# Patient Record
Sex: Male | Born: 1995 | Race: White | Hispanic: No | Marital: Single | Smoking: Never smoker
Health system: Southern US, Community
[De-identification: ages and names within clinical notes are randomized; demographics above are authoritative.]

## PROBLEM LIST (undated history)

## (undated) DIAGNOSIS — I10 Essential (primary) hypertension: Secondary | ICD-10-CM

## (undated) DIAGNOSIS — E039 Hypothyroidism, unspecified: Secondary | ICD-10-CM

## (undated) DIAGNOSIS — E109 Type 1 diabetes mellitus without complications: Secondary | ICD-10-CM

## (undated) HISTORY — DX: Type 1 diabetes mellitus without complications: E10.9

## (undated) HISTORY — DX: Hypothyroidism, unspecified: E03.9

---
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
## 2016-02-01 LAB — LIPID PANEL
CHOL/HDL RATIO: 3.4 ratio (ref <=5.0)
Cholesterol: 156 mg/dL (ref 125–170)
HDL: 46 mg/dL (ref 31–65)
LDL CALC: 94 mg/dL (ref <110)
Triglycerides: 81 mg/dL (ref 38–152)
VLDL: 16 mg/dL (ref <30)

## 2016-02-01 LAB — COMPREHENSIVE METABOLIC PANEL
ALT: 17 U/L (ref 8–46)
AST: 16 U/L (ref 12–32)
Albumin: 4.1 g/dL (ref 3.6–5.1)
Alkaline Phosphatase: 85 U/L (ref 48–230)
BUN: 14 mg/dL (ref 7–20)
CALCIUM: 9.3 mg/dL (ref 8.9–10.4)
CO2: 26 mmol/L (ref 20–31)
Chloride: 99 mmol/L (ref 98–110)
Creat: 0.88 mg/dL (ref 0.60–1.26)
GLUCOSE: 367 mg/dL — AB (ref 70–99)
POTASSIUM: 4.6 mmol/L (ref 3.8–5.1)
Sodium: 134 mmol/L — ABNORMAL LOW (ref 135–146)
Total Bilirubin: 0.9 mg/dL (ref 0.2–1.1)
Total Protein: 6.5 g/dL (ref 6.3–8.2)

## 2016-02-01 LAB — MICROALBUMIN / CREATININE URINE RATIO: CREATININE, URINE: 57 mg/dL (ref 20–370)

## 2016-02-01 LAB — HEMOGLOBIN A1C
HEMOGLOBIN A1C: 11.7 % — AB (ref <5.7)
Mean Plasma Glucose: 289 mg/dL

## 2016-02-01 LAB — T4, FREE: Free T4: 1.5 ng/dL — ABNORMAL HIGH (ref 0.8–1.4)

## 2016-02-01 LAB — T3, FREE: T3, Free: 3.1 pg/mL (ref 3.0–4.7)

## 2016-02-01 LAB — TSH: TSH: 2.36 m[IU]/L (ref 0.50–4.30)

## 2016-02-02 ENCOUNTER — Encounter: Payer: Self-pay | Admitting: *Deleted

## 2016-02-02 ENCOUNTER — Ambulatory Visit (INDEPENDENT_AMBULATORY_CARE_PROVIDER_SITE_OTHER): Payer: Medicaid Other | Admitting: "Endocrinology

## 2016-02-02 ENCOUNTER — Encounter: Payer: Self-pay | Admitting: "Endocrinology

## 2016-02-02 VITALS — BP 132/87 | HR 84 | Wt 152.0 lb

## 2016-02-02 DIAGNOSIS — E1065 Type 1 diabetes mellitus with hyperglycemia: Principal | ICD-10-CM

## 2016-02-02 DIAGNOSIS — E049 Nontoxic goiter, unspecified: Secondary | ICD-10-CM

## 2016-02-02 DIAGNOSIS — E10649 Type 1 diabetes mellitus with hypoglycemia without coma: Secondary | ICD-10-CM

## 2016-02-02 DIAGNOSIS — I10 Essential (primary) hypertension: Secondary | ICD-10-CM

## 2016-02-02 DIAGNOSIS — E1043 Type 1 diabetes mellitus with diabetic autonomic (poly)neuropathy: Secondary | ICD-10-CM

## 2016-02-02 DIAGNOSIS — E038 Other specified hypothyroidism: Secondary | ICD-10-CM

## 2016-02-02 DIAGNOSIS — E109 Type 1 diabetes mellitus without complications: Secondary | ICD-10-CM

## 2016-02-02 DIAGNOSIS — R000 Tachycardia, unspecified: Secondary | ICD-10-CM | POA: Diagnosis not present

## 2016-02-02 DIAGNOSIS — IMO0001 Reserved for inherently not codable concepts without codable children: Secondary | ICD-10-CM

## 2016-02-02 DIAGNOSIS — E063 Autoimmune thyroiditis: Secondary | ICD-10-CM

## 2016-02-02 LAB — POCT GLYCOSYLATED HEMOGLOBIN (HGB A1C): HEMOGLOBIN A1C: 11.7

## 2016-02-02 LAB — GLUCOSE, POCT (MANUAL RESULT ENTRY): POC GLUCOSE: 109 mg/dL — AB (ref 70–99)

## 2016-02-02 NOTE — Patient Instructions (Signed)
Follow up visit in 2 months. Please call Dr. Fransico Shenea Giacobbe in two weeks on a Sunday evening between 8:00-9:30 PM. Please repeat thyroid tests one week prior to next appointment.

## 2016-02-02 NOTE — Progress Notes (Signed)
Subjective:  Patient Name: Chad Holloway Date of Birth: 07-29-1996  MRN: [id number]  Chad Holloway  presents to the office today for follow-up evaluation and management of his T1DM, hypoglycemia, hypothyroidism, thyroiditis, goiter, autonomic neuropathy, tachycardia, growth delay, and hypertension.  HISTORY OF PRESENT ILLNESS:   Chad Holloway is a 20 y.o. Caucasian young man.  Eileen was unaccompanied.  1. Chad Holloway was diagnosed with T1DM in 2000 at age 6 years. His first visit to our clinic was on 04/19/05 when he was referred to [place] by his PCP, Dr. Loyola Mast, for E&M of his T1DM.  In 2009 we converted him to a Medtronic Paradigm 722 insulin pump. Although his HbA1c values were generally better on the pump, there were times when he was so noncompliant that his HbA1c value rose to > 14%. In the intervening years Chad Holloway has developed autonomic neuropathy and tachycardia as complications of poorly controlled T1DM. In addition, he developed hypothyroidism, secondary to Hashimoto's Thyroiditis. As a result of all these issues, there have been months in which his linear growth was compromised. He has also developed hypertension over time. He re-started his Medtronic 530G and Enlite sensor on 10/21/14, but often does not use the sensor  2. The patient's last PSSG visit was on 11/05/14.  A.On 12/12/15 he was admitted to the [facility] in [place], [place] for DKA. He says that he thought that he had been taking enough insulin, but no intercurrent illness was diagnosed that might have provoked the DKA.    B. In the interim, he had been generally healthy. He is enjoying college, but has to work harder and has more academic stress. He is supposed to take his 10 mg lisinopril and Synthroid every night, but misses 3-4 does per week. The Synthroid dose is supposed to be 25 mcg/day for 5 days per week and 37.5 mcg/day on Wednesdays and Sundays. He was not exercising very often at college, but is doing so more frequently now that he  is home for Summer vacation. He consumes about 6 caffeinated sodas per day.   3. Pertinent Review of Systems:  Constitutional: The patient feels "good".  Eyes: Vision is good with his glasses. His last eye appointment was in 2016. There were no signs of diabetic eye damage. He has a FU appointment soon.  Neck: The patient has no complaints of anterior neck swelling, soreness, tenderness, pressure, discomfort, or difficulty swallowing.   Heart: Heart rate increases with exercise or other physical activity. The patient has no complaints of palpitations, irregular heart beats, chest pain, or chest pressure.   Gastrointestinal: The patient has no complaints of postprandial bloating, excessive hunger, acid reflux, upset stomach, stomach aches or pains, diarrhea, or constipation.  Legs: Muscle mass and strength seem normal. There are no other complaints of numbness, tingling, burning, or pain. No edema is noted.  Feet: There are no obvious foot problems. There are no complaints of numbness, tingling, burning, or pain. No edema is noted. Neurologic: There are no recognized problems with muscle movement and strength, sensation, or coordination. GU: He has not had any nocturia for quite some time.   Diabetes ID: None - Mom will order one soon.   Blood Glucose Printout: He changes sites every 4-5 days. He checks BGs 1-6 times per day, average 4.4 times per day. He boluses 1-5 times per day. He sometimes goes for up to 24 hours without checking BGs or taking boluses. He has not had any documented low  BGs. He has  had 10 BGs >400. 6 of these 10 BGs were due to bad sites. 2/10 were due to poor sites. His average BG is 262, range 126 to >400. When his sites are working and he checks BGs 3-4 times per day and boluses 3-4 times per day, BGs vary from 143-188. He still sometimes eats but forgets to check BGs or bolus. He has not been wearing his sensor, so he has not had any threshold suspends.    MEDICAL, FAMILY,  AND SOCIAL HISTORY  Past Medical History  Diagnosis Date  . Diabetes mellitus type I   . Hypothyroidism     Family History  Problem Relation Age of Onset  . Diabetes Mother   . Hypertension Mother   . Thyroid disease Mother   . Obesity Mother   . Thyroid disease Father   . Diabetes Brother      Current outpatient prescriptions:  .  glucagon 1 MG injection, Follow package directions for low blood sugar., Disp: 3 each, Rfl: 4 .  GuaiFENesin (COUGH SYRUP PO), Take by mouth., Disp: , Rfl:  .  insulin lispro (HUMALOG) 100 UNIT/ML injection, USE 300 UNITS IN INSULIN PUMP EVERY 48 HOURS, Disp: 15 vial, Rfl: 4 .  levothyroxine (SYNTHROID, LEVOTHROID) 25 MCG tablet, Take 1 tablet 5 days a week and 1.5 tablets 2 days a week, Disp: 105 tablet, Rfl: 4 .  lidocaine-prilocaine (EMLA) cream, APPLY A SMALL AMOUNT TO SKIN AS DIRECTED 30 TO 45 MINUTES PRIOR TO INSERTING INSULIN PUMP INFUSION SET, Disp: 30 g, Rfl: 3 .  lisinopril (PRINIVIL,ZESTRIL) 10 MG tablet, Take 1 tablet (10 mg total) by mouth daily., Disp: 90 tablet, Rfl: 4 .  NOVOLOG FLEXPEN 100 UNIT/ML FlexPen, USE FOR BACKUP IF PUMP FAILS WITH SLIDING SCALE UP TO 12 UNITS SUBCUTANEOUSLY FOUR TIMES DAILY FOR HYPERGLYCEMIA AND DKA (Patient not taking: Reported on 11/05/2014), Disp: 5 pen, Rfl: 6  Allergies as of 02/02/2016  . (No Known Allergies)     reports that he has never smoked. He has never used smokeless tobacco. He reports that he does not drink alcohol or use illicit drugs. Pediatric History  Patient Guardian Status  . Mother:  Jaclyn Prime   Other Topics Concern  . Not on file   Social History Narrative   Lives with mom, step dad, 4 brothers         1. School and family: He just finished his freshman year at [employer] in [place], [place] in August. He wants to major in music education. He has a 3.4 GPA. 2. Activities: He has been doing a lot of marching band, concert band, and jazz band at school. He plays pick  up basketball a lot when he is home.  3. Primary Care Provider: Norman Clay, MD  REVIEW OF SYSTEMS: There are no other significant problems involving Chad Holloway's other body systems.   Objective:  Vital Signs:  BP 132/87 mmHg  Pulse 84  Wt 152 lb (68.947 kg)  No height on file for this encounter.  Ht Readings from Last 3 Encounters:  11/05/14 5' 5.95" (1.675 m) (12 %*, Z = -1.16)  10/27/14 5' 5.87" (1.673 m) (12 %*, Z = -1.19)  10/21/14 5' 6.02" (1.677 m) (13 %*, Z = -1.13)   * Growth percentiles are based on CDC 2-20 Years data.   Wt Readings from Last 3 Encounters:  02/02/16 152 lb (68.947 kg) (50 %*, Z = -0.01)  11/05/14 144 lb (65.318 kg) (46 %*, Z = -0.11)  10/27/14 142 lb 3.2 oz (64.501 kg) (43 %*, Z = -0.19)   * Growth percentiles are based on CDC 2-20 Years data.    There is no height on file to calculate BSA. No height on file for this encounter. 50%ile (Z=-0.01) based on CDC 2-20 Years weight-for-age data using vitals from 02/02/2016.  PHYSICAL EXAM:  Constitutional: The patient looks good overall. He is alert and bright. He is maturing more over time. He has gained 8 pounds in 15 months.  His height is at the 12.22%, but is gradually plateauing. His weight has increased to the 50%.  Eyes: There is no obvious arcus or proptosis. Moisture appears normal. Neck: The neck appears to be visibly normal. The strap muscles have increased in bulk over time. The thyroid gland is probably smaller at about 21+ grams in size. The right lobe has shrunk down to top-normal size. The left lobe is mildly enlarged. The consistency of the thyroid gland is relatively firm. The thyroid gland is not tender to palpation. Lungs: His lungs are clear. He moves air well.  Heart: Heart rate and rhythm are regular. Heart sounds S1 and S2 are normal. I did not appreciate any pathologic cardiac murmurs. Abdomen: The abdomen is normal in size. Bowel sounds are normal. There is no obvious hepatomegaly,  splenomegaly, or other mass effect.  Arms: Muscle size and bulk are normal for age. Hands: He has 1+ fine tremor. Phalangeal and metacarpophalangeal joints are normal. Palmar muscles are normal for age. Palmar skin is normal. Palmar moisture is also normal. Legs: Muscles appear normal for age. No edema is present.  Feet: The feet appears normal. He has a 1-2+ DP pulse on the right and 2+ DP pulse on the left.  Neurologic: Strength is normal for age in both the upper and lower extremities. Muscle tone is normal.  Sensation to touch is intact in the legs and feet.   LAB DATA:  Results for orders placed or performed in visit on 02/02/16  POCT Glucose (CBG)  Result Value Ref Range   POC Glucose 109 (A) 70 - 99 mg/dl   Labs 1/61/095/17/17: [id number] 09.8%11.7%  Labs 01/31/16: HbA1c 11.7%; TSH 2.36, free T4 1.5, free T3 3.1; CMP normal except for a sodium of 134 and glucose of 367; cholesterol 156, triglycerides 81, HDL 46, LDL 94; urine microalbumin too low to measure  Labs 10/19/14: HbA1c 10.2%; TSH 1.101, free T4 1.38, free T3 3.3  Labs 08/05/14: Hemoglobin A1c 10.3%, compared with 9.7% at last visit and with 10.7% at the prior visit.  Labs 11/26/13:  TSH 1.725, free T4 1.34, free T3 3.7; CMP normal, except for glucose 171; cholesterol 125, triglycerides 89, HDL 44, LDL 63; urinary microalbumin/creatinine ratio 3.8  Labs 07/31/13: TSH 2.298, free T4 1.29, free T3 3.6  Labs 04/04/13: TSH 4.249, free T4 1.29, free T3 4.0  Labs 01/09/13: TSH 3.044, free T4 1.29, free T3 3.1; Microalbumin/creatinine ratio 4.3; CMP normal except glucose of 161; cholesterol 137, triglycerides 61, HDL 51, LDL 74   Labs 06/17/12: TSH 0.928, free T4 1.26, free T3 3.7   Assessment and Plan:   ASSESSMENT:  1. Type 1 diabetes: His BGs are much higher due to him not taking as good care of his DM on a regular basis. There are too many gaps between BG checks and insulin boluses. His basal rates may need minor adjustment, but are doing  fairly well overall. Life often gets in the way of his DM care.  2. Hypoglycemia: He has not had any documented low BGs.  3. Hypothyroidism:  He was clinically and chemically euthyroid in March 2015 and again in February 2016 on his current Synthroid dose regimen. Since he has been taking his Synthroid only about 4 times per week, we can't rely on his recent TFT results to adjust his Synthroid dose. We need to repeat his TFTS in 6-8 weeks after he resumes taking the medication every day.  4. Growth delay: He appears to be having the typical end-of-puberty plateauing of growth velocity for height.  5. Hypertension: His BP is elevated. He has only been taking his lisinopril about 4 days per week. he needs to take it every day.    6. Goiter: His thyroid gland has decreased in size since his last visit. The process of waxing and waning of thyroid gland size is c/w evolving Hashimoto's thyroiditis.  7-8. Autonomic neuropathy and inappropriate sinus tachycardia: His heart rate has increased since last visit, paralleling the worsening in BG control.   PLAN:  1. Diagnostic: Reviewed recent lab results. Call Dr. Fransico Michael in two weeks on a Sunday evening between 8:00-8:30 PM to discuss BGs. Call earlier if having low BGs.  Repeat TFTs in 6-7 weeks.   2. Therapeutic: Take Synthroid 25 mcg/day for 5 days per week and 37.5 mcg/day for two days each week.  Take lisinopril 10 mg daily. New basal rates:  MN: 0.950 -> 1.00 5 AM:  1.15 -> 1. 30 8 AM: 1.00 -> 1.15 2 PM: 0.950 -> 1.10 8 PM: 0.850 -> 0.950  3. Patient education: Reviewed pump download and recent lab results. Discussed systems to improve his consistency with taking medications, checking BGs, and taking insulin boluses.  Discussed wearing a diabetes ID bracelet. Discussed the new Medtronic 630G pump and sensor.  4. Follow-up: 2 months   Level of Service: This visit lasted in excess of 60 minutes. More than 50% of the visit was devoted to  counseling.  [name], MD

## 2016-03-06 ENCOUNTER — Other Ambulatory Visit: Payer: Self-pay | Admitting: "Endocrinology

## 2016-03-06 ENCOUNTER — Other Ambulatory Visit: Payer: Self-pay | Admitting: Pediatrics

## 2016-03-06 DIAGNOSIS — IMO0001 Reserved for inherently not codable concepts without codable children: Secondary | ICD-10-CM

## 2016-03-06 DIAGNOSIS — E1065 Type 1 diabetes mellitus with hyperglycemia: Principal | ICD-10-CM

## 2016-04-03 ENCOUNTER — Ambulatory Visit: Payer: Medicaid Other | Admitting: "Endocrinology

## 2016-04-18 ENCOUNTER — Ambulatory Visit (INDEPENDENT_AMBULATORY_CARE_PROVIDER_SITE_OTHER): Payer: BC Managed Care – PPO | Admitting: "Endocrinology

## 2016-04-18 ENCOUNTER — Encounter: Payer: Self-pay | Admitting: *Deleted

## 2016-04-18 VITALS — BP 136/85 | HR 86 | Wt 157.4 lb

## 2016-04-18 DIAGNOSIS — I10 Essential (primary) hypertension: Secondary | ICD-10-CM

## 2016-04-18 DIAGNOSIS — E10649 Type 1 diabetes mellitus with hypoglycemia without coma: Secondary | ICD-10-CM | POA: Diagnosis not present

## 2016-04-18 DIAGNOSIS — E038 Other specified hypothyroidism: Secondary | ICD-10-CM

## 2016-04-18 DIAGNOSIS — E1043 Type 1 diabetes mellitus with diabetic autonomic (poly)neuropathy: Secondary | ICD-10-CM | POA: Diagnosis not present

## 2016-04-18 DIAGNOSIS — Z91199 Patient's noncompliance with other medical treatment and regimen due to unspecified reason: Secondary | ICD-10-CM

## 2016-04-18 DIAGNOSIS — E1065 Type 1 diabetes mellitus with hyperglycemia: Principal | ICD-10-CM

## 2016-04-18 DIAGNOSIS — E109 Type 1 diabetes mellitus without complications: Secondary | ICD-10-CM

## 2016-04-18 DIAGNOSIS — E049 Nontoxic goiter, unspecified: Secondary | ICD-10-CM

## 2016-04-18 DIAGNOSIS — E063 Autoimmune thyroiditis: Secondary | ICD-10-CM

## 2016-04-18 DIAGNOSIS — IMO0001 Reserved for inherently not codable concepts without codable children: Secondary | ICD-10-CM

## 2016-04-18 DIAGNOSIS — Z9119 Patient's noncompliance with other medical treatment and regimen: Secondary | ICD-10-CM

## 2016-04-18 LAB — POCT GLYCOSYLATED HEMOGLOBIN (HGB A1C): Hemoglobin A1C: 13.5

## 2016-04-18 LAB — GLUCOSE, POCT (MANUAL RESULT ENTRY): POC GLUCOSE: 102 mg/dL — AB (ref 70–99)

## 2016-04-18 NOTE — Patient Instructions (Signed)
Follow up visit in one month.  
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
## 2016-04-19 ENCOUNTER — Encounter: Payer: Self-pay | Admitting: "Endocrinology

## 2016-04-19 LAB — T4, FREE: Free T4: 1.4 ng/dL (ref 0.8–1.4)

## 2016-04-19 LAB — T3, FREE: T3 FREE: 3.5 pg/mL (ref 3.0–4.7)

## 2016-04-19 LAB — TSH: TSH: 2.45 mIU/L (ref 0.50–4.30)

## 2016-05-17 ENCOUNTER — Other Ambulatory Visit: Payer: Self-pay | Admitting: *Deleted

## 2016-05-17 ENCOUNTER — Encounter: Payer: Self-pay | Admitting: Family

## 2016-05-17 ENCOUNTER — Ambulatory Visit (INDEPENDENT_AMBULATORY_CARE_PROVIDER_SITE_OTHER): Payer: BC Managed Care – PPO | Admitting: Family

## 2016-05-17 VITALS — BP 134/81 | HR 75 | Wt 155.4 lb

## 2016-05-17 DIAGNOSIS — E109 Type 1 diabetes mellitus without complications: Secondary | ICD-10-CM | POA: Diagnosis not present

## 2016-05-17 DIAGNOSIS — E063 Autoimmune thyroiditis: Secondary | ICD-10-CM

## 2016-05-17 DIAGNOSIS — E038 Other specified hypothyroidism: Secondary | ICD-10-CM

## 2016-05-17 DIAGNOSIS — IMO0001 Reserved for inherently not codable concepts without codable children: Secondary | ICD-10-CM

## 2016-05-17 DIAGNOSIS — Z4681 Encounter for fitting and adjustment of insulin pump: Secondary | ICD-10-CM | POA: Diagnosis not present

## 2016-05-17 DIAGNOSIS — E1065 Type 1 diabetes mellitus with hyperglycemia: Principal | ICD-10-CM

## 2016-05-17 DIAGNOSIS — I10 Essential (primary) hypertension: Secondary | ICD-10-CM

## 2016-05-17 LAB — GLUCOSE, POCT (MANUAL RESULT ENTRY): POC GLUCOSE: 278 mg/dL — AB (ref 70–99)

## 2016-05-17 LAB — POCT GLYCOSYLATED HEMOGLOBIN (HGB A1C): HEMOGLOBIN A1C: 9.8

## 2016-05-17 NOTE — Patient Instructions (Signed)
Basal Changes  - 12am: 1.0--> 0.95 - 2pm: 1.10--> 1.05   - Work on using temp basals  - Especially useful during activity  - Continue checking at least 4x per day  - Pre bolus for food. Do at least 30 carbs prior to eating then  Give the rest after you finish.

## 2016-05-17 NOTE — Progress Notes (Signed)
Pediatric Endocrinology Diabetes Consultation Follow-up Visit  Chad Holloway [facility] 07/18/1996 [id number]  Chief Complaint: Follow-up type 1 diabetes   Chad Clay, MD   HPI: Chad Holloway  is a 20 y.o. male presenting for follow-up of type 1 diabetes.   1. Chad Holloway was diagnosed with T1DM in 2000 at age 70 years. His first visit to our clinic was on 04/19/05 when he was referred to [place] by his PCP, Dr. Loyola Holloway, for E&M of his T1DM.  In 2009 we converted him to a Medtronic Paradigm 722 insulin pump. Although his HbA1c values were generally better on the pump, there were times when he was so noncompliant that his HbA1c value rose to > 14%.  2. Since last visit to [facility] on 8/17, he has been well.  No ER visits or hospitalizations. Chad Holloway reports that since his last visit he has continued to work very hard to get his blood sugars back on track. He admits that over the summer he let his diabetes care slip when his girl friend broke up with him, but now he is back on track. He reports that he has been checking frequently and making sure to bolus for all of his food. He is currently bolusing after he eat. He reports very few lows, most of his lows occur when he is playing basketball or doing something high activity. He is back in college at [employer] and is in the band and is also in a fraternity. He wants to discuss starting a CGM when he is back in town for fall break to get extra help with his diabetes care. He is taking is Lisinopril "some days" and takes synthroid every day.   Insulin regimen: Basal Rates 12AM 1.0  5am 1.30  8am 1.15  2pm 1.10  8pm 0.950    Insulin to Carbohydrate Ratio 12AM  15  6am 12  9pm 15          Insulin Sensitivity Factor 12AM 60  6am 40  9pm 60         Target Blood Glucose 12AM 140  6am 110  9pm 140          Hypoglycemia: Able to feel low blood sugars.  No glucagon needed recently.  Blood glucose download: Checking Bg 6.3 times per day. Avg Bg 197. He has a  few scattered lows usually in the afternoon when he goes to the gym. None of his lows are severe or require glucagon.  Med-alert ID: Not currently wearing. Injection sites: butt and stomach  Annual labs due: 2017 Ophthalmology due: 2017 discussed today.     3. ROS: Greater than 10 systems reviewed with pertinent positives listed in HPI, otherwise neg. Constitutional: Reports that his energy is improved.  Eyes: No changes in vision Ears/Nose/Mouth/Throat: No difficulty swallowing. Cardiovascular: No palpitations Respiratory: No increased work of breathing Gastrointestinal: No constipation or diarrhea. No abdominal pain Genitourinary: No nocturia, no polyuria Musculoskeletal: No joint pain Neurologic: Normal sensation, no tremor Endocrine: No polydipsia.  No hyperpigmentation Psychiatric: Normal affect  Past Medical History:   Past Medical History:  Diagnosis Date  . Diabetes mellitus type I (HCC)   . Hypothyroidism     Medications:  Outpatient Encounter Prescriptions as of 05/17/2016  Medication Sig  . glucagon 1 MG injection Follow package directions for low blood sugar.  . insulin lispro (HUMALOG) 100 UNIT/ML injection USE 300 UNITS IN INSULIN PUMP EVERY 48 HOURS  . levothyroxine (SYNTHROID, LEVOTHROID) 25 MCG tablet Take 1 tablet 5  days a week and 1.5 tablets 2 days a week  . lisinopril (PRINIVIL,ZESTRIL) 10 MG tablet Take 1 tablet (10 mg total) by mouth daily.  Marland Kitchen. lisinopril (PRINIVIL,ZESTRIL) 10 MG tablet TAKE ONE TABLET BY MOUTH ONCE DAILY  . GuaiFENesin (COUGH SYRUP PO) Take by mouth.  . insulin aspart (NOVOLOG FLEXPEN) 100 UNIT/ML FlexPen Use up to 50 units daily (Patient not taking: Reported on 05/17/2016)  . lidocaine-prilocaine (EMLA) cream APPLY A SMALL AMOUNT TO SKIN AS DIRECTED 30 TO 45 MINUTES PRIOR TO INSERTING INSULIN PUMP INFUSION SET (Patient not taking: Reported on 05/17/2016)   No facility-administered encounter medications on file as of 05/17/2016.      Allergies: No Known Allergies  Surgical History: No past surgical history on file.  Family History:  Family History  Problem Relation Age of Onset  . Diabetes Mother   . Hypertension Mother   . Thyroid disease Mother   . Obesity Mother   . Thyroid disease Father   . Diabetes Brother       Social History: Lives with roommates in college.  Currently in is sophomore year of college.   Physical Exam:  Vitals:   05/17/16 1352 05/17/16 1357  BP:  134/81  Pulse:  75  Weight: 70.5 kg (155 lb 6.4 oz) 70.5 kg (155 lb 6.4 oz)   BP 134/81   Pulse 75   Wt 70.5 kg (155 lb 6.4 oz)  Body mass index: body mass index is unknown because there is no height or weight on file. No height on file for this encounter.  Ht Readings from Last 3 Encounters:  11/05/14 5' 5.95" (1.675 m) (12 %, Z= -1.16)*  10/27/14 5' 5.87" (1.673 m) (12 %, Z= -1.19)*  10/21/14 5' 6.02" (1.677 m) (13 %, Z= -1.13)*   * Growth percentiles are based on CDC 2-20 Years data.   Wt Readings from Last 3 Encounters:  05/17/16 70.5 kg (155 lb 6.4 oz) (53 %, Z= 0.09)*  04/18/16 71.4 kg (157 lb 6.4 oz) (57 %, Z= 0.18)*  02/02/16 68.9 kg (152 lb) (50 %, Z= -0.01)*   * Growth percentiles are based on CDC 2-20 Years data.    PHYSICAL EXAM:   Constitutional: He is happy, alert and interactive.  Eyes: There is no obvious arcus or proptosis. Moisture appears normal. Neck: The neck appears to be visibly normal.  The thyroid gland is normal in size. . The consistency of the thyroid gland is relatively firm. The thyroid gland is not tender to palpation. Lungs: His lungs are clear. He moves air well.  Heart: Heart rate and rhythm are regular. Heart sounds S1 and S2 are normal. I did not appreciate any pathologic cardiac murmurs. Abdomen: The abdomen is normal in size. Bowel sounds are normal. There is no obvious hepatomegaly, splenomegaly, or other mass effect.  Arms: Muscle size and bulk are normal for age. Hands: He  has no tremor. Phalangeal and metacarpophalangeal joints are normal. Palmar muscles are normal for age. Palmar skin is normal. Palmar moisture is also normal. Legs: Muscles appear normal for age. No edema is present.  Feet: The feet appears normal. He has a 2+ DP pulse on the right and a 2+ DP pulse on the left.  Neurologic: Strength is normal for age in both the upper and lower extremities. Muscle tone is normal.  Sensation to touch is intact in the legs and feet.  Labs: Last hemoglobin A1c:  Lab Results  Component Value Date   HGBA1C 9.8  05/17/2016   Results for orders placed or performed in visit on 05/17/16  POCT Glucose (CBG)  Result Value Ref Range   POC Glucose 278 (A) 70 - 99 mg/dl  POCT HgB Z6X  Result Value Ref Range   Hemoglobin A1C 9.8     Assessment/Plan: Tyresse is a 20 y.o. male with type 1 diabetes in fair/improving control. He has done much better with his diabetes care since his last visit. He appears very motivated to maintain good care after all the hard work he has done. He is interested in starting a CGM for further monitoring of his blood sugars.  1. DM w/o complication type I, uncontrolled (HCC) - Continue to check bg at least 4 times per day and before driving  - Discussed using temp basal prior/during/after exercise to prevent hypoglycemia.  - Bolus prior to eating to stop large spikes in blood sugars after meals.  - POCT Glucose (CBG) - POCT HgB A1C  2. Essential Hypertension  Continue to take Lisinopril 10 mg daily   3. Hypothyroidism, acquired, autoimmune Synthroid daily as prescribed.   4. Insulin Pump Titration Basal Changes  - 12am: 1.0--> 0.95 - 2pm: 1.10--> 1.05  Follow-up:   2 months  Medical decision-making:  > 40 minutes spent, more than 50% of appointment was spent discussing diagnosis and management of symptoms  Gretchen Short, FNP-C

## 2016-05-18 ENCOUNTER — Encounter: Payer: Self-pay | Admitting: Family

## 2016-06-04 ENCOUNTER — Telehealth: Payer: Self-pay | Admitting: "Endocrinology

## 2016-06-04 NOTE — Telephone Encounter (Signed)
1. I called Chad Holloway to discuss his recent TFTs. 2. His recent TFTs are WNL, but are outside of the ideal replacement range for him.  3. I asked him to change his Synthroid dosage to 37.5 mcg/day on three days per week and 25 mcg/day on the remaining 4 days each week. David StallBRENNAN,MICHAEL J

## 2016-06-23 ENCOUNTER — Telehealth (INDEPENDENT_AMBULATORY_CARE_PROVIDER_SITE_OTHER): Payer: Self-pay | Admitting: Family

## 2016-06-23 ENCOUNTER — Other Ambulatory Visit (INDEPENDENT_AMBULATORY_CARE_PROVIDER_SITE_OTHER): Payer: Self-pay | Admitting: *Deleted

## 2016-06-23 DIAGNOSIS — IMO0001 Reserved for inherently not codable concepts without codable children: Secondary | ICD-10-CM

## 2016-06-23 DIAGNOSIS — E1065 Type 1 diabetes mellitus with hyperglycemia: Principal | ICD-10-CM

## 2016-06-23 MED ORDER — LEVOTHYROXINE SODIUM 25 MCG PO TABS: ORAL_TABLET | ORAL | 4 refills | Status: DC

## 2016-06-23 MED ORDER — INSULIN ASPART 100 UNIT/ML ~~LOC~~ SOLN: SUBCUTANEOUS | 6 refills | Status: DC

## 2016-06-23 MED ORDER — LISINOPRIL 10 MG PO TABS
10.0000 mg | ORAL_TABLET | Freq: Every day | ORAL | 4 refills | Status: DC
Start: 2016-06-23 — End: 2018-04-03

## 2016-06-23 NOTE — Telephone Encounter (Signed)
Scripts sent

## 2016-06-23 NOTE — Telephone Encounter (Signed)
Please change pharm to [facility] @ [address] in [place] and send rxs for lisinopril, synthroid, and insulin ([employer] will not cover humalog any longer). Mom stated [name] changed the dosage for either the lisinopril or the synthroid @ the last visit, but doesn't know which one.

## 2016-07-03 ENCOUNTER — Ambulatory Visit (INDEPENDENT_AMBULATORY_CARE_PROVIDER_SITE_OTHER): Payer: Self-pay | Admitting: Family

## 2016-08-28 ENCOUNTER — Ambulatory Visit (INDEPENDENT_AMBULATORY_CARE_PROVIDER_SITE_OTHER): Payer: Self-pay | Admitting: Family

## 2016-08-31 ENCOUNTER — Encounter (INDEPENDENT_AMBULATORY_CARE_PROVIDER_SITE_OTHER): Payer: Self-pay | Admitting: Family

## 2016-08-31 ENCOUNTER — Ambulatory Visit (INDEPENDENT_AMBULATORY_CARE_PROVIDER_SITE_OTHER): Payer: BC Managed Care – PPO | Admitting: Family

## 2016-08-31 VITALS — BP 122/60 | HR 68 | Wt 155.2 lb

## 2016-08-31 DIAGNOSIS — E038 Other specified hypothyroidism: Secondary | ICD-10-CM | POA: Diagnosis not present

## 2016-08-31 DIAGNOSIS — I10 Essential (primary) hypertension: Secondary | ICD-10-CM | POA: Diagnosis not present

## 2016-08-31 DIAGNOSIS — Z4681 Encounter for fitting and adjustment of insulin pump: Secondary | ICD-10-CM

## 2016-08-31 DIAGNOSIS — E063 Autoimmune thyroiditis: Secondary | ICD-10-CM

## 2016-08-31 DIAGNOSIS — F54 Psychological and behavioral factors associated with disorders or diseases classified elsewhere: Secondary | ICD-10-CM | POA: Diagnosis not present

## 2016-08-31 DIAGNOSIS — IMO0001 Reserved for inherently not codable concepts without codable children: Secondary | ICD-10-CM

## 2016-08-31 DIAGNOSIS — E1065 Type 1 diabetes mellitus with hyperglycemia: Secondary | ICD-10-CM | POA: Diagnosis not present

## 2016-08-31 LAB — T4, FREE: FREE T4: 1.4 ng/dL (ref 0.8–1.4)

## 2016-08-31 LAB — TSH: TSH: 2.03 m[IU]/L (ref 0.50–4.30)

## 2016-08-31 LAB — POCT GLYCOSYLATED HEMOGLOBIN (HGB A1C): HEMOGLOBIN A1C: 10.1

## 2016-08-31 LAB — GLUCOSE, POCT (MANUAL RESULT ENTRY): POC Glucose: 262 mg/dl — AB (ref 70–99)

## 2016-08-31 NOTE — Patient Instructions (Signed)
Basal Changes  - 8am: 1.15--> 1.20   - 2pm: 1.05--> 1.15  Sensitivity Changes  9pm: 60--> 50   - Stay on track.  - Try rotating sites  - Bolus before eating  - Check blood sugar at least 4 x per day  - Keep glucose with you at all times  - Make sure you are giving insulin with each meal and to correct for high blood sugars  - If you need anything, please do nt hesitate to contact me via MyChart or by calling the office.   [phone]  - Follow up in about a month. Then we will use mychart to try to hold you accountable.

## 2016-08-31 NOTE — Progress Notes (Signed)
Pediatric Endocrinology Diabetes Consultation Follow-up Visit  Chad Holloway 09/29/1995 [id number]  Chief Complaint: Follow-up type 1 diabetes   Norman ClayLOWE,MELISSA V, MD   HPI: Chad Holloway  is a 20 y.o. male presenting for follow-up of type 1 diabetes.   1. Chad Holloway was diagnosed with T1DM in 2000 at age 20 years. His first visit to our clinic was on 04/19/05 when he was referred to us by his PCP, Dr. Loyola MastMelissa Lowe, for E&M of his T1DM.  In 2009 we converted him to a Medtronic Paradigm 722 insulin pump. Although his HbA1c values were generally better on the pump, there were times when he was so noncompliant that his HbA1c value rose to > 14%.  2. Since last visit to [facility] on 8/17, he has been well.  No ER visits or hospitalizations.   Chad Holloway is doing well since his last appointment. 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 He states that he has been slacking off with his diabetes care at times. In the month of November, he struggled to remember to check his blood sugars as often as he could. He has gotten back on track over the last month and feels like things are going better overall. He still feels like his blood sugars are running high in the afternoon and at night.   He is wearing a Medtronic 530g insulin pump, he will not wear the Enlite sensor with it because he finds it inaccurate. He is only using his buttocks for his pump sites. He has been trying to bolus prior to eating. He acknowledges that his blood sugars are much better controlled when he does remember to bolus before he eats. He is interested in the 670g insulin pump with the Guardian 3 CGM.   He is taking 37.5 mcg of Synthroid. He forgets to take it twice per week. He is also taking 10mg  of Lisinopril that he forgets to take about twice per week. He denies fatigue, constipation and cold intolerance.   Insulin regimen: Basal Rates 12AM 0.95  5am 1.30  8am 1.15  2pm 1.05  8pm 0.950    Insulin to  Carbohydrate Ratio 12AM  15  6am 12  9pm 15          Insulin Sensitivity Factor 12AM 60  6am 40  9pm 60         Target Blood Glucose 12AM 140  6am 110  9pm 140          Hypoglycemia: Able to feel low blood sugars.  No glucagon needed recently.  Blood glucose download: Checking Bg 5.4 times per day. Avg Bg 205.   - He is using 49% basal and 51% bolus   - Above target 56% and Below target 4%.  Med-alert ID: Not currently wearing. Injection sites: butt  Annual labs due: 2018  Ophthalmology due: 2017 discussed today.     3. ROS: Greater than 10 systems reviewed with pertinent positives listed in HPI, otherwise neg. Constitutional: Reports that his energy is good and has a good appetite.  Eyes: No changes in vision. Wears glasses. Denies blurry vision.  Ears/Nose/Mouth/Throat: No difficulty swallowing. Denies throat pain and neck pain  Cardiovascular: No palpitations. Denies chest pain and tachycardia Respiratory: No increased work of breathing. No SOB  Gastrointestinal: No constipation or diarrhea. No abdominal pain Genitourinary: No nocturia, no polyuria Musculoskeletal: No joint pain Neurologic: Normal sensation, no tremor Endocrine: No polydipsia.  No hyperpigmentation Psychiatric: Normal affect. Denies  anxiety   Past Medical History:   Past Medical History:  Diagnosis Date  . Diabetes mellitus type I (HCC)   . Hypothyroidism     Medications:  Outpatient Encounter Prescriptions as of 08/31/2016  Medication Sig  . glucagon 1 MG injection Follow package directions for low blood sugar.  . insulin aspart (NOVOLOG) 100 UNIT/ML injection Use 300 units in insulin pump every 48 hours  . levothyroxine (SYNTHROID, LEVOTHROID) 25 MCG tablet Take 1 tablet 5 days a week and 1.5 tablets 2 days a week  . lidocaine-prilocaine (EMLA) cream APPLY A SMALL AMOUNT TO SKIN AS DIRECTED 30 TO 45 MINUTES PRIOR TO INSERTING INSULIN PUMP INFUSION SET  . lisinopril (PRINIVIL,ZESTRIL)  10 MG tablet Take 1 tablet (10 mg total) by mouth daily.  . insulin aspart (NOVOLOG FLEXPEN) 100 UNIT/ML FlexPen Use up to 50 units daily (Patient not taking: Reported on 05/17/2016)  . [DISCONTINUED] GuaiFENesin (COUGH SYRUP PO) Take by mouth.  . [DISCONTINUED] insulin lispro (HUMALOG) 100 UNIT/ML injection USE 300 UNITS IN INSULIN PUMP EVERY 48 HOURS (Patient not taking: Reported on 08/31/2016)   No facility-administered encounter medications on file as of 08/31/2016.     Allergies: No Known Allergies  Surgical History: No past surgical history on file.  Family History:  Family History  Problem Relation Age of Onset  . Diabetes Mother   . Hypertension Mother   . Thyroid disease Mother   . Obesity Mother   . Thyroid disease Father   . Diabetes Brother       Social History: Lives with roommates in college.  Currently in is sophomore year of college.   Physical Exam:  Vitals:   08/31/16 1355  BP: 122/60  Pulse: 68  Weight: 155 lb 3.2 oz (70.4 kg)   BP 122/60   Pulse 68   Wt 155 lb 3.2 oz (70.4 kg)  Body mass index: body mass index is unknown because there is no height or weight on file. No height on file for this encounter.  Ht Readings from Last 3 Encounters:  11/05/14 5' 5.95" (1.675 m) (12 %, Z= -1.16)*  10/27/14 5' 5.87" (1.673 m) (12 %, Z= -1.19)*  10/21/14 5' 6.02" (1.677 m) (13 %, Z= -1.13)*   * Growth percentiles are based on CDC 2-20 Years data.   Wt Readings from Last 3 Encounters:  08/31/16 155 lb 3.2 oz (70.4 kg) (51 %, Z= 0.04)*  05/17/16 155 lb 6.4 oz (70.5 kg) (53 %, Z= 0.09)*  04/18/16 157 lb 6.4 oz (71.4 kg) (57 %, Z= 0.18)*   * Growth percentiles are based on CDC 2-20 Years data.    PHYSICAL EXAM: General: Well developed, well nourished male in no acute distress.  He is interactive at visit.  Head: Normocephalic, atraumatic.   Eyes:  Pupils equal and round. EOMI.  Sclera white.  No eye drainage.   Ears/Nose/Mouth/Throat: Nares patent, no  nasal drainage.  Normal dentition, mucous membranes moist.  Oropharynx intact. Neck: supple, no cervical lymphadenopathy, no thyromegaly Cardiovascular: regular rate, normal S1/S2, no murmurs Respiratory: No increased work of breathing.  Lungs clear to auscultation bilaterally.  No wheezes. Abdomen: soft, nontender, nondistended. Normal bowel sounds.  No appreciable masses  Extremities: warm, well perfused, cap refill < 2 sec.   Musculoskeletal: Normal muscle mass.  Normal strength Skin: warm, dry.  No rash or lesions. Neurologic: alert and oriented, normal speech and gait   Labs: Last hemoglobin A1c:  Lab Results  Component Value Date  HGBA1C 10.1 08/31/2016   Results for orders placed or performed in visit on 08/31/16  POCT Glucose (CBG)  Result Value Ref Range   POC Glucose 262 (A) 70 - 99 mg/dl  POCT HgB [id number]  Result Value Ref Range   Hemoglobin A1C 10.1     Assessment/Plan: Chad Holloway is a 20 y.o. male with type 1 diabetes in poor control. He has been struggling to consistently take care of his diabetes. He goes through times where he does very well but when he gets busy, his diabetes care suffers. A CGM would be very helpful for Chad Holloway. He needs to have TFT's done today.   1. DM w/o complication type I, uncontrolled (HCC) - Continue to check bg at least 4 times per day and before driving  - Discussed using temp basal prior/during/after exercise to prevent hypoglycemia.  - Bolus prior to eating to stop large spikes in blood sugars after meals.  - POCT Glucose (CBG) - POCT HgB A1C - Discussed Medtronic 670g. Dexcom and Omnipod  - Reviewed insulin pump download  2. Essential Hypertension  Continue to take Lisinopril 10 mg daily   3. Hypothyroidism, acquired, autoimmune Synthroid daily as prescribed.  TFT's today   4. Insulin Pump Titration 12AM 0.95  5am 1.30  8am 1.15--> 1.20  2pm 1.05--> 1.15  8pm 0.950    Insulin Sensitivity Factor 12AM 60  6am 40  9pm 60--> 50           5. Maladaptive Behaviors  - Discussed importance of consistency with diabetes are  - Use Mychart to communicate blood sugars and as an accountability tool  - Answered all questions.   Follow-up:   1 month. Prior to returning to college.   Medical decision-making:  > 40 minutes spent, more than 50% of appointment was spent discussing diagnosis and management of symptoms  Gretchen ShortSpenser Brinsley Wence, FNP-C

## 2017-07-10 ENCOUNTER — Other Ambulatory Visit (INDEPENDENT_AMBULATORY_CARE_PROVIDER_SITE_OTHER): Payer: Self-pay | Admitting: Family

## 2017-07-10 ENCOUNTER — Telehealth (INDEPENDENT_AMBULATORY_CARE_PROVIDER_SITE_OTHER): Payer: Self-pay | Admitting: Family

## 2017-07-10 ENCOUNTER — Other Ambulatory Visit (INDEPENDENT_AMBULATORY_CARE_PROVIDER_SITE_OTHER): Payer: Self-pay | Admitting: *Deleted

## 2017-07-10 DIAGNOSIS — E1065 Type 1 diabetes mellitus with hyperglycemia: Principal | ICD-10-CM

## 2017-07-10 DIAGNOSIS — IMO0001 Reserved for inherently not codable concepts without codable children: Secondary | ICD-10-CM

## 2017-07-10 MED ORDER — INSULIN ASPART 100 UNIT/ML ~~LOC~~ SOLN: SUBCUTANEOUS | 6 refills | Status: DC

## 2017-07-10 NOTE — Telephone Encounter (Signed)
°  Who's calling (name and relationship to patient) : self Best contact number: [phone] Provider they see: Ovidio KinSpenser Reason for call: Pt called in to sched F/U appt with Provider for 08/29/17. Also requested assistance for refill on insulin aspart (NOVOLOG) 100 UNIT/ML injection, he had already contacted Pharmacy, but, they said they were waiting on authorization from our office, he requested to inform [name] about him scheduling his F/U appt for Dec.     Pharmacy:  [facility] [address] - [place], [place] - [address], [address] Medication:  insulin aspart (NOVOLOG) 100 UNIT/ML injection

## 2017-07-11 NOTE — Telephone Encounter (Signed)
LVM advised script sent, and make sure to attend the appt.

## 2017-08-29 ENCOUNTER — Ambulatory Visit (INDEPENDENT_AMBULATORY_CARE_PROVIDER_SITE_OTHER): Payer: BC Managed Care – PPO | Admitting: Family

## 2017-08-30 ENCOUNTER — Ambulatory Visit (INDEPENDENT_AMBULATORY_CARE_PROVIDER_SITE_OTHER): Payer: BC Managed Care – PPO | Admitting: Family

## 2017-08-30 ENCOUNTER — Encounter (INDEPENDENT_AMBULATORY_CARE_PROVIDER_SITE_OTHER): Payer: Self-pay | Admitting: Family

## 2017-08-30 VITALS — BP 146/84 | HR 80 | Ht 66.3 in | Wt 165.2 lb

## 2017-08-30 DIAGNOSIS — IMO0001 Reserved for inherently not codable concepts without codable children: Secondary | ICD-10-CM

## 2017-08-30 DIAGNOSIS — F54 Psychological and behavioral factors associated with disorders or diseases classified elsewhere: Secondary | ICD-10-CM | POA: Diagnosis not present

## 2017-08-30 DIAGNOSIS — Z23 Encounter for immunization: Secondary | ICD-10-CM | POA: Diagnosis not present

## 2017-08-30 DIAGNOSIS — I10 Essential (primary) hypertension: Secondary | ICD-10-CM | POA: Diagnosis not present

## 2017-08-30 DIAGNOSIS — R739 Hyperglycemia, unspecified: Secondary | ICD-10-CM

## 2017-08-30 DIAGNOSIS — R7309 Other abnormal glucose: Secondary | ICD-10-CM

## 2017-08-30 DIAGNOSIS — E1065 Type 1 diabetes mellitus with hyperglycemia: Secondary | ICD-10-CM

## 2017-08-30 DIAGNOSIS — E063 Autoimmune thyroiditis: Secondary | ICD-10-CM

## 2017-08-30 LAB — POCT GLYCOSYLATED HEMOGLOBIN (HGB A1C): HEMOGLOBIN A1C: 9.5

## 2017-08-30 LAB — POCT GLUCOSE (DEVICE FOR HOME USE): POC GLUCOSE: 178 mg/dL — AB (ref 70–99)

## 2017-08-30 NOTE — Progress Notes (Signed)
Pediatric Endocrinology Diabetes Consultation Follow-up Visit  [facility] 10/31/1995 [id number]  Chief Complaint: Follow-up type 1 diabetes   Loyola MastLowe, Melissa, MD   HPI: Chad Holloway  is a 21 y.o. male presenting for follow-up of type 1 diabetes.   1. Chad Holloway was diagnosed with T1DM in 2000 at age 21 years. His first visit to our clinic was on 04/19/05 when he was referred to us by his PCP, Dr. Loyola MastMelissa Lowe, for E&M of his T1DM.  In 2009 we converted him to a Medtronic Paradigm 722 insulin pump. Although his HbA1c values were generally better on the pump, there were times when he was so noncompliant that his HbA1c value rose to > 14%.  2. Since last visit to [facility] on 12/17, he has been well.  No ER visits or hospitalizations.   Chad Holloway reports that he is doing ok but has been very busy with school. He feels like he has done better with his diabetes care recently. He knows that when he gets stressed or upset, he stops taking care of his diabetes. He is wearing a Medtronic 530g insulin pump, he does not use the CGM. In an effort to do better with his diabetes care, he has set alarms on his phone to remind him to check and bolus every 4-5 hours. He has seen improvements since doing this.   He has not been taking Lisinopril consistently. He is suppose to take 10 mg of Lisinopril per day but estimates he only takes it 1-2 days per week which is why his blood pressure is high today. He is on 37.5 mcg of Levothyroxine as well but reports he only takes it 3-4 days per week. He denies fatigue, constipation and cold intolerance.   Insulin regimen: Basal Rates 12AM 0.95  5am 1.30  8am 1.20  2pm 1.15  8pm 0.950    Insulin to Carbohydrate Ratio 12AM  15  6am 12  9pm 15          Insulin Sensitivity Factor 12AM 60  6am 40  9pm 60         Target Blood Glucose 12AM 140  6am 110  9pm 140          Hypoglycemia: Able to feel low blood sugars.  No glucagon needed recently. Feels shaky and tired when  low. Keeps glucose with him.  Insulin Pump download: Avg Bg 184. Checking 3.4 times per day   - Target Range: In range 50%. Above range 42% and below range 2%  - He is using 49 units per day. 54% basal and 46% bolus.  Med-alert ID: Not currently wearing. Injection sites: butt and occasionally abdomen.  Annual labs due: 08/2018 (ordered last on 08/2017)  Ophthalmology due: 2019 discussed today.     3. ROS: Greater than 10 systems reviewed with pertinent positives listed in HPI, otherwise neg. Constitutional: He has good energy and appetite. Weight is stable.  Eyes: No changes in vision. Wears glasses. Denies blurry vision.  Ears/Nose/Mouth/Throat: No difficulty swallowing. Denies throat pain and neck pain  Cardiovascular: No palpitations. Denies chest pain and tachycardia Respiratory: No increased work of breathing. No SOB  Gastrointestinal: No constipation or diarrhea. No abdominal pain Genitourinary: No nocturia, no polyuria Musculoskeletal: No joint pain Neurologic: Normal sensation, no tremor Endocrine: No polydipsia.  No hyperpigmentation Psychiatric: Normal affect. Denies anxiety and depression.   Past Medical History:   Past Medical History:  Diagnosis Date  . Diabetes mellitus type I (HCC)   . Hypothyroidism  Medications:  Outpatient Encounter Medications as of 08/30/2017  Medication Sig  . glucagon 1 MG injection Follow package directions for low blood sugar.  . insulin aspart (NOVOLOG) 100 UNIT/ML injection Use 300 units in insulin pump every 48 hours  . levothyroxine (SYNTHROID, LEVOTHROID) 25 MCG tablet Take 1 tablet 5 days a week and 1.5 tablets 2 days a week  . lisinopril (PRINIVIL,ZESTRIL) 10 MG tablet Take 1 tablet (10 mg total) by mouth daily.  . insulin aspart (NOVOLOG FLEXPEN) 100 UNIT/ML FlexPen Use up to 50 units daily (Patient not taking: Reported on 05/17/2016)  . [DISCONTINUED] lidocaine-prilocaine (EMLA) cream APPLY A SMALL AMOUNT TO SKIN AS DIRECTED 30  TO 45 MINUTES PRIOR TO INSERTING INSULIN PUMP INFUSION SET (Patient not taking: Reported on 08/30/2017)   No facility-administered encounter medications on file as of 08/30/2017.     Allergies: No Known Allergies  Surgical History: No past surgical history on file.  Family History:  Family History  Problem Relation Age of Onset  . Diabetes Mother   . Hypertension Mother   . Thyroid disease Mother   . Obesity Mother   . Thyroid disease Father   . Diabetes Brother       Social History: Lives with roommates in college.  Currently in Junior year of college.   Physical Exam:  Vitals:   08/30/17 1334  BP: (!) 146/84  Pulse: 80  Weight: 165 lb 3.2 oz (74.9 kg)  Height: 5' 6.3" (1.684 m)   BP (!) 146/84   Pulse 80   Ht 5' 6.3" (1.684 m)   Wt 165 lb 3.2 oz (74.9 kg)   BMI 26.42 kg/m  Body mass index: body mass index is 26.42 kg/m. Growth percentile SmartLinks can only be used for patients less than 78 years old.  Ht Readings from Last 3 Encounters:  08/30/17 5' 6.3" (1.684 m)  11/05/14 5' 5.95" (1.675 m) (12 %, Z= -1.16)*  10/27/14 5' 5.87" (1.673 m) (12 %, Z= -1.19)*   * Growth percentiles are based on CDC (Boys, 2-20 Years) data.   Wt Readings from Last 3 Encounters:  08/30/17 165 lb 3.2 oz (74.9 kg)  08/31/16 155 lb 3.2 oz (70.4 kg) (51 %, Z= 0.04)*  05/17/16 155 lb 6.4 oz (70.5 kg) (53 %, Z= 0.09)*   * Growth percentiles are based on CDC (Boys, 2-20 Years) data.    PHYSICAL EXAM:  General: Well developed, well nourished male in no acute distress.  Appears stated age. He is alert and oriented.  Head: Normocephalic, atraumatic.   Eyes:  Pupils equal and round. EOMI.  Sclera white.  No eye drainage.Wearing glasses.    Ears/Nose/Mouth/Throat: Nares patent, no nasal drainage.  Normal dentition, mucous membranes moist.  Oropharynx intact. Neck: supple, no cervical lymphadenopathy, no thyromegaly Cardiovascular: regular rate, normal S1/S2, no  murmurs Respiratory: No increased work of breathing.  Lungs clear to auscultation bilaterally.  No wheezes. Abdomen: soft, nontender, nondistended. Normal bowel sounds.  No appreciable masses  Extremities: warm, well perfused, cap refill < 2 sec.   Musculoskeletal: Normal muscle mass.  Normal strength Skin: warm, dry.  No rash or lesions. Mild lipohypertrophy to buttocks.  Neurologic: alert and oriented, normal speech and gait   Labs: Last hemoglobin A1c:  Lab Results  Component Value Date   HGBA1C 9.5 08/30/2017   Results for orders placed or performed in visit on 08/30/17  POCT Glucose (Device for Home Use)  Result Value Ref Range   Glucose Fasting, POC  70 - 99 mg/dL   POC Glucose 161178 (A) 70 - 99 mg/dl  POCT HgB [id number]  Result Value Ref Range   Hemoglobin A1C 9.5     Assessment/Plan: Chad Holloway is a 21 y.o. male with Type 1 diabetes in poor control on insulin pump therapy. His A1c is 9.5% which is above the ADA goal of <7.5%. He struggles with consistency in his diabetes care. When Chad Holloway gets stressed he stops taking care of his diabetes which leads to long periods with hyperglycemia. He needs more insulin at dinner. He is hypertensive today and has not been taking Lisinopril as prescribed. He is due for annual labs.   1. DM w/o complication type I, uncontrolled (HCC)/Hyperglycemia/Elevated a1c - Reviewed insulin pump settings  - Discussed importance of rotating sites to avoid lipohypertrophy  - Reviewed carb counting and the importance of accurate carb counting.  - Advised to check bg at least 4 x per day and always before driving.  - POCT Glucose (CBG) - POCT HgB A1C - Discussed insulin pump technology and CGM technology.  - Annual labs: TFTs, Lipid panel, Microalbumin/creatinine.   2. Essential Hypertension  - Advised that he must take 10 mg of Lisinopril DAILY  - Reviewed blood pressure.   3. Hypothyroidism, acquired, autoimmune TFT's today - Take 37.5 mcg of Synthroid per  day.   4. Insulin Pump Titration  Insulin Sensitivity Factor 12AM 60  6am 40  8pm 50--> 45          5. Maladaptive Behaviors  - Advised the he must show up to appointments every 3 months.  - Discussed transitioning to adulthood and dealing with stress/diabetes.  - Answered questions.   6. Influenza Vaccine - Counseling given prior to injection. Answered questions.   Follow-up:   3 months. Use mychart as needed   I have spent >40 minutes with >50% of time in counseling, education and instruction. When a patient is on insulin, intensive monitoring of blood glucose levels is necessary to avoid hyperglycemia and hypoglycemia. Severe hyperglycemia/hypoglycemia can lead to hospital admissions and be life threatening.    Gretchen ShortSpenser Terriyah Westra,  FNP-C  Pediatric Specialist  [address]  [place], [id number]  Tele: [phone]

## 2017-08-30 NOTE — Patient Instructions (Signed)
-   Take 10 mg of Lisinopril daily  - Take Synthroid  - Bolus for all food  - Carb count accurately  - continue current pump settings.  - Work on rotating site   - Come back first thing in the morning for fasting labs.  - Follow up in 3 months   - A1c 9.5%.   - Please sign up for MyChart. This is a communication tool that allows you to send an email directly to me. This can be used for questions, prescriptions and blood sugar reports. We will also release labs to you with instructions on MyChart. Please do not use MyChart if you need immediate or emergency assistance. Ask our wonderful front office staff if you need assistance.

## 2017-09-20 ENCOUNTER — Telehealth (INDEPENDENT_AMBULATORY_CARE_PROVIDER_SITE_OTHER): Payer: Self-pay | Admitting: Family

## 2017-09-20 NOTE — Telephone Encounter (Signed)
°  Who's calling (name and relationship to patient) : Alvino Chapelllen (mom) Best contact number: [phone] Provider they see: Sharene SkeansHickling  Reason for call: Mom called left voice message to see if labs can be done in [place] lab.  Please call.     PRESCRIPTION REFILL ONLY  Name of prescription:  Pharmacy:

## 2017-09-24 NOTE — Telephone Encounter (Signed)
Adv Mom Alvino Chapelllen any [facility] or [facility] lab should be able to see the orders but will call location he plans to go to and confirm. Call to [facility]- that location has closed. Call back to mom to adv but adv per Lab tech in our office she can see the orders and therefore any [facility] or [facility] lab should be able to see them. Mom reports he will go to the lab in [place] on the way back to class.

## 2017-11-30 ENCOUNTER — Encounter (INDEPENDENT_AMBULATORY_CARE_PROVIDER_SITE_OTHER): Payer: Self-pay | Admitting: Family

## 2017-11-30 ENCOUNTER — Ambulatory Visit (INDEPENDENT_AMBULATORY_CARE_PROVIDER_SITE_OTHER): Payer: BC Managed Care – PPO | Admitting: Family

## 2017-11-30 VITALS — BP 132/80 | HR 76 | Ht 66.3 in | Wt 165.0 lb

## 2017-11-30 DIAGNOSIS — R7309 Other abnormal glucose: Secondary | ICD-10-CM

## 2017-11-30 DIAGNOSIS — E063 Autoimmune thyroiditis: Secondary | ICD-10-CM | POA: Diagnosis not present

## 2017-11-30 DIAGNOSIS — I10 Essential (primary) hypertension: Secondary | ICD-10-CM | POA: Diagnosis not present

## 2017-11-30 DIAGNOSIS — Z4681 Encounter for fitting and adjustment of insulin pump: Secondary | ICD-10-CM | POA: Diagnosis not present

## 2017-11-30 DIAGNOSIS — R739 Hyperglycemia, unspecified: Secondary | ICD-10-CM

## 2017-11-30 DIAGNOSIS — F54 Psychological and behavioral factors associated with disorders or diseases classified elsewhere: Secondary | ICD-10-CM | POA: Diagnosis not present

## 2017-11-30 DIAGNOSIS — IMO0001 Reserved for inherently not codable concepts without codable children: Secondary | ICD-10-CM

## 2017-11-30 DIAGNOSIS — E1065 Type 1 diabetes mellitus with hyperglycemia: Secondary | ICD-10-CM

## 2017-11-30 DIAGNOSIS — E11649 Type 2 diabetes mellitus with hypoglycemia without coma: Secondary | ICD-10-CM

## 2017-11-30 LAB — POCT GLUCOSE (DEVICE FOR HOME USE): POC Glucose: 223 mg/dl — AB (ref 70–99)

## 2017-11-30 LAB — POCT GLYCOSYLATED HEMOGLOBIN (HGB A1C): HEMOGLOBIN A1C: 9.7

## 2017-11-30 NOTE — Patient Instructions (Signed)
-   Carb Ratio Changes   - 12am: 15--> 12  - 6am: 12  - 4pm:  12--> 10   - 9pm: 15--> 12    - Check blood sugar at least 4 x per day   - Always before driving  - Bolus before eating  - Rotate pump sites--> back, love handles, legs

## 2017-11-30 NOTE — Progress Notes (Signed)
Pediatric Endocrinology Diabetes Consultation Follow-up Visit  Chad Holloway [facility] December 10, 1995 [id number]  Chief Complaint: Follow-up type 1 diabetes   Chad Mast, MD   HPI: Chad Holloway  is a 22 y.o. male presenting for follow-up of type 1 diabetes.   1. Chad Holloway was diagnosed with T1DM in 2000 at age 41 years. His first visit to our clinic was on 04/19/05 when he was referred to [place] by his PCP, Dr. Loyola Holloway, for E&M of his T1DM.  In 2009 we converted him to a Medtronic Paradigm 722 insulin pump. Although his HbA1c values were generally better on the pump, there were times when he was so noncompliant that his HbA1c value rose to > 14%.  2. Since last visit to [facility] on 12/18, he has been well.  No ER visits or hospitalizations.   Chad Holloway feels like he has done better with his diabetes care. He is checking his blood sugar more and trying to do better bolusing. He boluses after he eats because he usually over estimates his carb count if he gives bolus before eating. He occasionally forgets to bolus when he snacks. He is frustrated because his blood sugars are still running high overall.   Chad Holloway recently began seeing a therapist to help him deal with his stress and anxiety. He feels like counseling has been very helpful and that is why he is being more compliant with his diabetes care. He even tried rotating his sites to his legs and abdomen.   He takes 37.5 mcg of Synthroid per day. He has been more consistent lately but still misses 1- 2 doses per week. He also takes 10 mg of Lisinopril per day. He is working on taking it more consistently.    Insulin regimen: Basal Rates 12AM 0.95  5am 1.30  8am 1.20  2pm 1.15  8pm 0.950    Insulin to Carbohydrate Ratio 12AM  15  6am 12  9pm 15          Insulin Sensitivity Factor 12AM 60  6am 40  9pm 60         Target Blood Glucose 12AM 140  6am 110  9pm 140          Hypoglycemia: Able to feel low blood sugars.  No glucagon needed recently. Feels  shaky and tired when low. Keeps glucose with him.  Insulin Pump download: Avg Bg 214. Checking 5 x per day   - Target Range: In range 42%, above range 54% and below range 4%   - Pattern of hyperglycemia between 6pm-2am   - Using 55 units per day. 52% bolus and 48% basal.   Med-alert ID: Not currently wearing. Injection sites: butt and occasionally abdomen.  Annual labs due: 11/2018 (labs done today)  Ophthalmology due: 2019 discussed today.     3. ROS: Greater than 10 systems reviewed with pertinent positives listed in HPI, otherwise neg. Constitutional: Good energy and appetite.   Eyes: No changes in vision. Wears glasses. Denies blurry vision.  Ears/Nose/Mouth/Throat: No difficulty swallowing. Denies throat pain and neck pain  Cardiovascular: No palpitations. Denies chest pain and tachycardia Respiratory: No increased work of breathing. No SOB  Gastrointestinal: No constipation or diarrhea. No abdominal pain Genitourinary: No nocturia, no polyuria Musculoskeletal: No joint pain Neurologic: Normal sensation, no tremor Endocrine: No polydipsia.  No hyperpigmentation Psychiatric: Normal affect. Denies anxiety and depression. Currently seeing therapist at school.   Past Medical History:   Past Medical History:  Diagnosis Date  . Diabetes mellitus type  I (HCC)   . Hypothyroidism     Medications:  Outpatient Encounter Medications as of 11/30/2017  Medication Sig  . glucagon 1 MG injection Follow package directions for low blood sugar.  . insulin aspart (NOVOLOG) 100 UNIT/ML injection Use 300 units in insulin pump every 48 hours  . levothyroxine (SYNTHROID, LEVOTHROID) 25 MCG tablet Take 1 tablet 5 days a week and 1.5 tablets 2 days a week  . lisinopril (PRINIVIL,ZESTRIL) 10 MG tablet Take 1 tablet (10 mg total) by mouth daily.  . insulin aspart (NOVOLOG FLEXPEN) 100 UNIT/ML FlexPen Use up to 50 units daily (Patient not taking: Reported on 05/17/2016)   No facility-administered  encounter medications on file as of 11/30/2017.     Allergies: No Known Allergies  Surgical History: No past surgical history on file.  Family History:  Family History  Problem Relation Age of Onset  . Diabetes Mother   . Hypertension Mother   . Thyroid disease Mother   . Obesity Mother   . Thyroid disease Father   . Diabetes Brother       Social History: Lives with roommates in college.  Currently in Junior year of college.   Physical Exam:  Vitals:   11/30/17 1047  BP: 132/80  Pulse: 76  Weight: 165 lb (74.8 kg)  Height: 5' 6.3" (1.684 m)   BP 132/80   Pulse 76   Ht 5' 6.3" (1.684 m)   Wt 165 lb (74.8 kg)   BMI 26.39 kg/m  Body mass index: body mass index is 26.39 kg/m. Growth percentile SmartLinks can only be used for patients less than 79 years old.  Ht Readings from Last 3 Encounters:  11/30/17 5' 6.3" (1.684 m)  08/30/17 5' 6.3" (1.684 m)  11/05/14 5' 5.95" (1.675 m) (12 %, Z= -1.16)*   * Growth percentiles are based on CDC (Boys, 2-20 Years) data.   Wt Readings from Last 3 Encounters:  11/30/17 165 lb (74.8 kg)  08/30/17 165 lb 3.2 oz (74.9 kg)  08/31/16 155 lb 3.2 oz (70.4 kg) (51 %, Z= 0.04)*   * Growth percentiles are based on CDC (Boys, 2-20 Years) data.    PHYSICAL EXAM:  General: Well developed, well nourished male in no acute distress.  Alert and oriented.  Head: Normocephalic, atraumatic.   Eyes:  Pupils equal and round. EOMI.  Sclera white.  No eye drainage.  Wearing glasses  Ears/Nose/Mouth/Throat: Nares patent, no nasal drainage.  Normal dentition, mucous membranes moist.  Oropharynx intact. Neck: supple, no cervical lymphadenopathy, no thyromegaly Cardiovascular: regular rate, normal S1/S2, no murmurs Respiratory: No increased work of breathing.  Lungs clear to auscultation bilaterally.  No wheezes. Abdomen: soft, nontender, nondistended. Normal bowel sounds.  No appreciable masses  Extremities: warm, well perfused, cap refill < 2  sec.   Musculoskeletal: Normal muscle mass.  Normal strength Skin: warm, dry.  No rash or lesions. Neurologic: alert and oriented, normal speech  Labs:  Lab Results  Component Value Date   HGBA1C 9.7 11/30/2017   Results for orders placed or performed in visit on 11/30/17  POCT Glucose (Device for Home Use)  Result Value Ref Range   Glucose Fasting, POC  70 - 99 mg/dL   POC Glucose 540 (A) 70 - 99 mg/dl  POCT HgB J8J  Result Value Ref Range   Hemoglobin A1C 9.7     Assessment/Plan: Jabari is a 22 y.o. male with Type 1 diabetes in poor control on insulin pump therapy. Timathy is checking  his blood sugar more and bolusing better. He needs stronger carb ratio throughout the day to decrease his hyperglycemia. He would also greatly benefit from bolusing before eating so that his insulin has time to work and prevent blood sugar spikes. His hemoglobin A1c is 9.7% which is above the ADA goal of <7.5%. Euthyroid on 37.5 mcg of Synthroid. Blood pressure if mildly elevated on 10 mg of Lisinopril.   1. DM w/o complication type I, uncontrolled (HCC)/Hyperglycemia/hyperglycemia/Elevated a1c - Rotate insulin pump sites  - Reviewed insulin pump settings.  - check bg at least 4 x per day and always before driving.  - Advised to give bolus 15 minutes prior to eating to prevent blood sugar spikes.  - Reviewed carb counting.  - POCT glucose as above.  - POCT A1c  - Annuals labs: Lipid panel, TFT's, Microablumin  - Discussed insulin pumps and CGM's. Gave information on Medtronic, Tandem and OMnipod pumps.   2. Essential Hypertension  - Stressed importance of taking Lisinopril daily as prescribed.  - Continue 10 mg of Lisinopril   3. Hypothyroidism, acquired, autoimmune - 37.5 mcg of Synthroid per day  - TFT's ordered   4. Insulin Pump Titration Insulin to Carbohydrate Ratio 12AM  15--> 12   6am 12  4pm ( new time) 10  9pm  15 --> 12       - I spent extensive time reviewing blood sugar  download and insulin pump download to make changes to pump settings.   5. Maladaptive Behaviors  - Continue counseling at school  - Discussed DMV criteria to keep license.  - Praise given for increasing blood sugar checks    Follow-up:   3 months. Use mychart as needed   I have spent >40 minutes with >50% of time in counseling, education and instruction. When a patient is on insulin, intensive monitoring of blood glucose levels is necessary to avoid hyperglycemia and hypoglycemia. Severe hyperglycemia/hypoglycemia can lead to hospital admissions and be life threatening.    Gretchen ShortSpenser Carrieann Spielberg,  FNP-C  Pediatric Specialist  [address]  [place], [id number]  Tele: [phone]

## 2017-12-01 LAB — MICROALBUMIN / CREATININE URINE RATIO
CREATININE, URINE: 126 mg/dL (ref 20–320)
MICROALB UR: 0.8 mg/dL
Microalb Creat Ratio: 6 mcg/mg creat (ref <30)

## 2017-12-01 LAB — LIPID PANEL
CHOL/HDL RATIO: 2.9 (calc) (ref <5.0)
Cholesterol: 120 mg/dL (ref <200)
HDL: 42 mg/dL (ref >40)
LDL Cholesterol (Calc): 68 mg/dL (calc)
NON-HDL CHOLESTEROL (CALC): 78 mg/dL (ref <130)
Triglycerides: 34 mg/dL (ref <150)

## 2017-12-01 LAB — T4, FREE: FREE T4: 1.3 ng/dL (ref 0.8–1.4)

## 2017-12-01 LAB — TSH: TSH: 1.6 m[IU]/L (ref 0.40–4.50)

## 2017-12-01 LAB — T4: T4, Total: 9.6 ug/dL (ref 5.1–10.3)

## 2018-03-08 ENCOUNTER — Ambulatory Visit (INDEPENDENT_AMBULATORY_CARE_PROVIDER_SITE_OTHER): Payer: BC Managed Care – PPO | Admitting: Family

## 2018-04-02 ENCOUNTER — Ambulatory Visit (INDEPENDENT_AMBULATORY_CARE_PROVIDER_SITE_OTHER): Payer: BC Managed Care – PPO | Admitting: Family

## 2018-04-03 ENCOUNTER — Ambulatory Visit (INDEPENDENT_AMBULATORY_CARE_PROVIDER_SITE_OTHER): Payer: BC Managed Care – PPO | Admitting: Pediatric Endocrinology

## 2018-04-03 ENCOUNTER — Encounter (INDEPENDENT_AMBULATORY_CARE_PROVIDER_SITE_OTHER): Payer: Self-pay | Admitting: Pediatric Endocrinology

## 2018-04-03 ENCOUNTER — Telehealth (INDEPENDENT_AMBULATORY_CARE_PROVIDER_SITE_OTHER): Payer: Self-pay | Admitting: Family

## 2018-04-03 VITALS — BP 116/78 | HR 90 | Ht 66.54 in | Wt 169.6 lb

## 2018-04-03 DIAGNOSIS — E1065 Type 1 diabetes mellitus with hyperglycemia: Secondary | ICD-10-CM | POA: Diagnosis not present

## 2018-04-03 DIAGNOSIS — Z4681 Encounter for fitting and adjustment of insulin pump: Secondary | ICD-10-CM

## 2018-04-03 DIAGNOSIS — E063 Autoimmune thyroiditis: Secondary | ICD-10-CM

## 2018-04-03 DIAGNOSIS — IMO0001 Reserved for inherently not codable concepts without codable children: Secondary | ICD-10-CM

## 2018-04-03 DIAGNOSIS — I10 Essential (primary) hypertension: Secondary | ICD-10-CM

## 2018-04-03 LAB — POCT GLUCOSE (DEVICE FOR HOME USE): POC Glucose: 143 mg/dl — AB (ref 70–99)

## 2018-04-03 LAB — POCT GLYCOSYLATED HEMOGLOBIN (HGB A1C): HbA1c, POC (controlled diabetic range): 9 % — AB (ref 0.0–7.0)

## 2018-04-03 MED ORDER — GLUCAGON (RDNA) 1 MG IJ KIT: PACK | INTRAMUSCULAR | 4 refills | Status: DC

## 2018-04-03 MED ORDER — INSULIN ASPART 100 UNIT/ML ~~LOC~~ SOLN: SUBCUTANEOUS | 6 refills | Status: DC

## 2018-04-03 MED ORDER — LISINOPRIL 10 MG PO TABS: 10.0000 mg | ORAL_TABLET | Freq: Every day | ORAL | 4 refills | Status: DC

## 2018-04-03 MED ORDER — DEXCOM G6 TRANSMITTER MISC: 1.0000 | 3 refills | Status: DC

## 2018-04-03 MED ORDER — GLUCOSE BLOOD VI STRP: ORAL_STRIP | 6 refills | Status: AC

## 2018-04-03 MED ORDER — LEVOTHYROXINE SODIUM 25 MCG PO TABS: ORAL_TABLET | ORAL | 4 refills | Status: DC

## 2018-04-03 MED ORDER — DEXCOM G6 SENSOR MISC: 1.0000 | 11 refills | Status: DC

## 2018-04-03 NOTE — Telephone Encounter (Signed)
Received a fax from [employer] for a PA. PA initiated and sent for further review.

## 2018-04-03 NOTE — Telephone Encounter (Signed)
°  Who's calling (name and relationship to patient) : Annice PihJackie Scientist, research (physical sciences)(Pharmacist) Best contact number: [phone] Provider they see: Ovidio KinSpenser Reason for call: Annice PihJackie lvm at 3:19pm and stated that insurance won't cover the meter if it will be purchased there. She stated the rx for the meter needs to go to a medical supply company. She stated she needs to know what kind of test strips pt needs.

## 2018-04-03 NOTE — Progress Notes (Signed)
Pediatric Endocrinology Diabetes Consultation Follow-up Visit  [facility] 12/18/1995 [id number]  Chief Complaint: Follow-up type 1 diabetes   Loyola MastLowe, Melissa, MD   HPI: Chad Holloway  is a 22 y.o. male presenting for follow-up of type 1 diabetes.   1. Chad Holloway was diagnosed with T1DM in 2000 at age 22 years. His first visit to our clinic was on 04/19/05 when he was referred to us by his PCP, Dr. Loyola MastMelissa Lowe, for E&M of his T1DM.  In 2009 we converted him to a Medtronic Paradigm 722 insulin pump. Although his HbA1c values were generally better on the pump, there were times when he was so noncompliant that his HbA1c value rose to > 14%.  2. Since last visit to [facility] on 11/30/17, he has been well.  No ER visits or hospitalizations.   Chad Holloway feels that he is the healthiest that he has ever been. He credits Research officer, trade unionpenser with encouraging him to get his life back on track. He is running at least a mile 6 days a week. He is eating healthier foods and lower carbs. In the past month he has had 2 sugars over 400- which is the fewest he has seen in a long time. He has noticed an increase in hypoglycemia which he feels is secondary to his exercise. He knows about temp basal but was not sure how to use it around exercise. He owns a few older sensors but never thought that they were comfortable or useful. He is interested in trying the Dexcom G6.   He has continued to see the therapist as needed when he is at school- mostly for stress management.   He has started to work on bolusing before eating. He has noticed that it works a lot better.   He feels that he is doing better with remembering to bolus when he snacks.   He think that the few high sugars he has had have been secondary to "bad sites". He can now feel that his sugar is high when it is above about 210.   He feels that he is doing really well with taking his Synthroid and his Lisinopril every night before bed.  He is taking 25 mcg of Synthroid and 10 mg of  Lisinopril.   Insulin regimen: Basal Rates 12AM 0.95  5am 1.30  8am 1.20  2pm 1.15  8pm 0.950    Insulin to Carbohydrate Ratio 12AM  12  4pm 10  9pm 12          Insulin Sensitivity Factor 12AM 60  6am 40  9pm 60         Target Blood Glucose 12AM 140  6am 110  9pm 140          Hypoglycemia: Able to feel low blood sugars.  No glucagon needed recently. Feels shaky and tired when low. Keeps glucose with him.  Insulin Pump download:   Avg bG 201 +/- 81. Checking 5.7 times per day.   Above Target 57%, below target 6 %, 37% in target.   Random hypoglycemia throughout the day.   Most sugars still above target.   62 units per day. 350 grams of carbs. 42% basal, 58% bolus.    Last visit: Avg Bg 214. Checking 5 x per day   - Target Range: In range 42%, above range 54% and below range 4%   - Pattern of hyperglycemia between 6pm-2am   - Using 55 units per day. 52% bolus and 48% basal.   Med-alert ID: Not  currently wearing.  Injection sites: butt and occasionally abdomen. Working on legs.  Annual labs due: 11/2018  Ophthalmology due: 2019 discussed today. - appointment scheduled next week.     3. ROS: Greater than 10 systems reviewed with pertinent positives listed in HPI, otherwise neg. Constitutional: Good energy and appetite.   Eyes: No changes in vision. Wears glasses. Denies blurry vision.  Ears/Nose/Mouth/Throat: No difficulty swallowing. Denies throat pain and neck pain  Cardiovascular: No palpitations. Denies chest pain and tachycardia Respiratory: No increased work of breathing. No SOB  Gastrointestinal: No constipation or diarrhea. No abdominal pain Genitourinary: No nocturia, no polyuria Musculoskeletal: No joint pain Neurologic: Normal sensation, no tremor Endocrine: No polydipsia.  No hyperpigmentation Psychiatric: Normal affect. Denies anxiety and depression. Currently seeing therapist at school [employer]  Past Medical History:   Past Medical History:   Diagnosis Date  . Diabetes mellitus type I (HCC)   . Hypothyroidism     Medications:  Outpatient Encounter Medications as of 04/03/2018  Medication Sig  . glucagon 1 MG injection Follow package directions for low blood sugar.  . insulin aspart (NOVOLOG) 100 UNIT/ML injection Use 300 units in insulin pump every 48 hours  . levothyroxine (SYNTHROID, LEVOTHROID) 25 MCG tablet Take 1 tablet 5 days a week and 1.5 tablets 2 days a week  . lisinopril (PRINIVIL,ZESTRIL) 10 MG tablet Take 1 tablet (10 mg total) by mouth daily.  . [DISCONTINUED] glucagon 1 MG injection Follow package directions for low blood sugar.  . [DISCONTINUED] insulin aspart (NOVOLOG) 100 UNIT/ML injection Use 300 units in insulin pump every 48 hours  . [DISCONTINUED] levothyroxine (SYNTHROID, LEVOTHROID) 25 MCG tablet Take 1 tablet 5 days a week and 1.5 tablets 2 days a week  . [DISCONTINUED] lisinopril (PRINIVIL,ZESTRIL) 10 MG tablet Take 1 tablet (10 mg total) by mouth daily.  . Continuous Blood Gluc Sensor (DEXCOM G6 SENSOR) MISC 1 each by Does not apply route as directed. 1 sensor every 10 days  . Continuous Blood Gluc Transmit (DEXCOM G6 TRANSMITTER) MISC 1 each by Does not apply route every 3 (three) months.  Marland Kitchen glucose blood test strip Use as instructed. Check sugar 6 times per day. For use with linking meter for [employer] pump  . insulin aspart (NOVOLOG FLEXPEN) 100 UNIT/ML FlexPen Use up to 50 units daily (Patient not taking: Reported on 05/17/2016)   No facility-administered encounter medications on file as of 04/03/2018.     Allergies: No Known Allergies  Surgical History: No past surgical history on file.  Family History:  Family History  Problem Relation Age of Onset  . Diabetes Mother   . Hypertension Mother   . Thyroid disease Mother   . Obesity Mother   . Thyroid disease Father   . Diabetes Brother       Social History: Lives with roommates in college. Getting an apartment.  Currently in Senior  year of college. [employer] in [place]  Physical Exam:  Vitals:   04/03/18 1309  BP: 116/78  Pulse: 90  Weight: 169 lb 9.6 oz (76.9 kg)  Height: 5' 6.54" (1.69 m)   BP 116/78   Pulse 90   Ht 5' 6.54" (1.69 m)   Wt 169 lb 9.6 oz (76.9 kg)   BMI 26.94 kg/m  Body mass index: body mass index is 26.94 kg/m. Growth percentile SmartLinks can only be used for patients less than 15 years old.  Ht Readings from Last 3 Encounters:  04/03/18 5' 6.54" (1.69 m)  11/30/17 5'  6.3" (1.684 m)  08/30/17 5' 6.3" (1.684 m)   Wt Readings from Last 3 Encounters:  04/03/18 169 lb 9.6 oz (76.9 kg)  11/30/17 165 lb (74.8 kg)  08/30/17 165 lb 3.2 oz (74.9 kg)   PHYSICAL EXAM:  General: Well developed, well nourished male in no acute distress.  Alert and oriented.  Head: Normocephalic, atraumatic.   Eyes:  Pupils equal and round. EOMI.  Sclera white.  No eye drainage.  Wearing glasses  Ears/Nose/Mouth/Throat: Nares patent, no nasal drainage.  Normal dentition, mucous membranes moist.  Oropharynx intact. Neck: supple, no cervical lymphadenopathy, no thyromegaly Cardiovascular: regular rate, normal S1/S2, no murmurs Respiratory: No increased work of breathing.  Lungs clear to auscultation bilaterally.  No wheezes. Abdomen: soft, nontender, nondistended. Normal bowel sounds.  No appreciable masses  Extremities: warm, well perfused, cap refill < 2 sec.   Musculoskeletal: Normal muscle mass.  Normal strength Skin: warm, dry.  No rash or lesions. Neurologic: alert and oriented, normal speech  Labs:   Lab Results  Component Value Date   HGBA1C 9.0 (A) 04/03/2018   Results for orders placed or performed in visit on 04/03/18  POCT Glucose (Device for Home Use)  Result Value Ref Range   Glucose Fasting, POC  70 - 99 mg/dL   POC Glucose 762 (A) 70 - 99 mg/dl  POCT glycosylated hemoglobin (Hb A1C)  Result Value Ref Range   Hemoglobin A1C  4.0 - 5.6 %   HbA1c POC (<> result, manual  entry)  4.0 - 5.6 %   HbA1c, POC (prediabetic range)  5.7 - 6.4 %   HbA1c, POC (controlled diabetic range) 9.0 (A) 0.0 - 7.0 %    Assessment/Plan: Purvis is a 22 y.o. male with Type 1 diabetes in poor control on insulin pump therapy. Othell is checking his blood sugar more and bolusing better.  At last visit they increased his carb ratios. He is now bolusing before eating more often. He feels that hypoglycemia has been exercise related.  His hemoglobin A1c is 9.0% which is above the ADA goal of <7.5%. This is a nice drop from 9.7% at last visit. Euthyroid on 25 mcg of Synthroid. Blood pressure stable on 10 mg of Lisinopril.   1. DM w/o complication type I, uncontrolled (HCC)/Hyperglycemia/hyperglycemia/Elevated a1c - Rotate insulin pump sites  - Reviewed insulin pump settings.  - check bg at least 4 x per day and always before driving.  - Advised to give bolus 15 minutes prior to eating to prevent blood sugar spikes.  - Reviewed carb counting.  - POCT glucose as above.  - POCT A1c  - Annuals labs: Lipid panel, TFT's, Microablumin done last visit and normal.  - Discussed CGMs. Dexcom G6 prescription sent to pharmacy.   2. Essential Hypertension  - Stressed importance of taking Lisinopril daily as prescribed.  - Continue 10 mg of Lisinopril   3. Hypothyroidism, acquired, autoimmune - 25 mcg of Synthroid per day  - TFT done last visit were stable.   4. Insulin Pump Titration Insulin regimen: Basal Rates 12AM 0.95-> 1.05  5am 1.30 -> 1.4  8am 1.20 -> 1.3  2pm 1.15 -> 1.25  8pm 0.950 -> 1.05  Total 26.55 -> 28.95  - I spent extensive time reviewing blood sugar download and insulin pump download to make changes to pump settings.   5. Maladaptive Behaviors  - Continue counseling at school  - Discussed DMV criteria to keep license.  - Praise given for increasing blood sugar  checks    Follow-up:   3 months. Use mychart as needed   Level of Service: This visit lasted in excess of 25  minutes. More than 50% of the visit was devoted to counseling. When a patient is on insulin, intensive monitoring of blood glucose levels is necessary to avoid hyperglycemia and hypoglycemia. Severe hyperglycemia/hypoglycemia can lead to hospital admissions and be life threatening.    Dessa Phi, MD Pediatric Specialist  [address]  [place], [address]  Tele: [phone]

## 2018-04-03 NOTE — Patient Instructions (Addendum)
Insulin regimen: Basal Rates 12AM 0.95-> 1.05  5am 1.30 -> 1.4  8am 1.20 -> 1.3  2pm 1.15 -> 1.25  8pm 0.950 -> 1.05  Total 26.55 -> 28.95  No change to other settings.  If issues with settings- please send a MyChart message.   Work on using [employer]emp Basal when you are exercising if you are getting low after exercising. It is not unusual to see lows 1-6 hours after exercise. Pay attention to what your body is doing.   Dexom Rx sent to pharmacy. They should be able to run it under your pharmacy benefit. If it is too expensive- call Dexcom to ask about DME benefit.   Once you have your Dexcom- Call Lorena to schedule a Dexcom start session.   Rules of 150: Total carbs for day <150 grams Total exercise for week >150 minutes Target blood sugar 150

## 2018-04-04 ENCOUNTER — Telehealth (INDEPENDENT_AMBULATORY_CARE_PROVIDER_SITE_OTHER): Payer: Self-pay | Admitting: Pediatric Endocrinology

## 2018-04-04 NOTE — Telephone Encounter (Signed)
Received a fax from [facility], Dexcom has been denied. Will contact patient and let them know he needs to speak with his insurance company and see who his DME supplier is. We will then forward the prescription to them.   Spoke with patient and let him know the above information, patient states he will need to speak with his mom about this. He will contact us and let us know as soon as he knows.

## 2018-04-04 NOTE — Telephone Encounter (Signed)
°  Who's calling (name and relationship to patient) : Jaclyn PrimeDeese,Ellen Nora  Best contact number: [phone] ([phone]  Provider they see: Deretha EmoryBadik , ([name] previously)  Reason for call: Mother states that insurance is not going to cover "anything", she would like assistance with the appeal process.    Name of prescription: Continuous Blood Gluc Transmit (DEXCOM G6 TRANSMITTER) MISC

## 2018-04-04 NOTE — Telephone Encounter (Signed)
Returned TC to mother to advise that insurance may not cover it as pharmacy benefits. She said that [name] is interested in the T-slim insulin pump. Advised that she can go online and apply for it. And provided phone number for rep [facility] with [employer]. She will go that route and see if her insurance will cover it.

## 2018-04-08 ENCOUNTER — Ambulatory Visit (INDEPENDENT_AMBULATORY_CARE_PROVIDER_SITE_OTHER): Payer: BC Managed Care – PPO | Admitting: Family

## 2018-07-03 ENCOUNTER — Telehealth (INDEPENDENT_AMBULATORY_CARE_PROVIDER_SITE_OTHER): Payer: Self-pay | Admitting: Family

## 2018-07-03 NOTE — Telephone Encounter (Signed)
°  Who's calling (name and relationship to patient) : Chad Holloway (patient) Best contact number: [phone] Provider they see: Ovidio Kin  Reason for call: Patient called reschedule appt to 08/30/18.  He stated he will send readings thru his Mychart to office    PRESCRIPTION REFILL ONLY  Name of prescription:  Pharmacy:

## 2018-07-05 ENCOUNTER — Ambulatory Visit (INDEPENDENT_AMBULATORY_CARE_PROVIDER_SITE_OTHER): Payer: BC Managed Care – PPO | Admitting: Family

## 2018-08-20 ENCOUNTER — Encounter (INDEPENDENT_AMBULATORY_CARE_PROVIDER_SITE_OTHER): Payer: Self-pay | Admitting: *Deleted

## 2018-08-20 ENCOUNTER — Telehealth (INDEPENDENT_AMBULATORY_CARE_PROVIDER_SITE_OTHER): Payer: Self-pay | Admitting: Family

## 2018-08-20 NOTE — Telephone Encounter (Signed)
Mother called stating patient was diagnosed with strep throat at school and was given amoxicillin. Lymph node was swollen. Swelling did not go down. Patient was worried. Saw a different dr who stated amoxicillin would not have gotten rid of the swelling and prescribed a different antibiotic and has been on this since Sunday. Patient has now scheduled an appointment with an Ear nose and throat because the swelling has not gone down. Mother is afraid patient will request to have a biopsy. Mother trust Ovidio KinSpenser and would like his opinion about this. Mother is requesting we call patient back at [phone] to discuss or mom at [phone]. [facility].  Rufina FalcoEmily M Hull

## 2018-08-20 NOTE — Telephone Encounter (Signed)
Routed to provider.  Thoughts?? 

## 2018-08-20 NOTE — Telephone Encounter (Signed)
I would suggest they follow the advice that ENT gives. If they feel like a biopsy is the best choice then I would support that. They should wait to see ENT, get the diagnosis and advice from them and then can make a decision on plan.

## 2018-08-20 NOTE — Telephone Encounter (Signed)
°  Who's calling (name and relationship to patient) : Chad Holloway (self)  Best contact number: [phone]  Provider they see: Dalbert GarnetBeasley  Reason for call: [name] called and would like to talk with [name] before his next appointment, he wants to discuss latest numbers and some upcoming appointments that he has.    PRESCRIPTION REFILL ONLY  Name of prescription:  Pharmacy:

## 2018-08-20 NOTE — Telephone Encounter (Signed)
Spoke to [name], he advises the lymph node swelling has not gone down, he sees ENT tomorrow. He states he trusts VF CorporationSpenser and wants him to be aware of the situation and to have input if needed. He will call is tomorrow after his appt and let us know what the ENT says.

## 2018-08-21 ENCOUNTER — Other Ambulatory Visit (INDEPENDENT_AMBULATORY_CARE_PROVIDER_SITE_OTHER): Payer: Self-pay | Admitting: Family

## 2018-08-21 ENCOUNTER — Other Ambulatory Visit (INDEPENDENT_AMBULATORY_CARE_PROVIDER_SITE_OTHER): Payer: Self-pay | Admitting: *Deleted

## 2018-08-21 ENCOUNTER — Telehealth (INDEPENDENT_AMBULATORY_CARE_PROVIDER_SITE_OTHER): Payer: Self-pay | Admitting: Family

## 2018-08-21 MED ORDER — INSULIN GLARGINE 100 UNIT/ML SOLOSTAR PEN: PEN_INJECTOR | SUBCUTANEOUS | 12 refills | Status: DC

## 2018-08-21 MED ORDER — INSULIN PEN NEEDLE 31G X 5 MM MISC: 3 refills | Status: DC

## 2018-08-21 MED ORDER — BASAGLAR KWIKPEN 100 UNIT/ML ~~LOC~~ SOPN: PEN_INJECTOR | SUBCUTANEOUS | 5 refills | Status: DC

## 2018-08-21 MED ORDER — INSULIN ASPART 100 UNIT/ML FLEXPEN: PEN_INJECTOR | SUBCUTANEOUS | 12 refills | Status: DC

## 2018-08-21 NOTE — Telephone Encounter (Signed)
Script sent  

## 2018-08-21 NOTE — Telephone Encounter (Signed)
°  Who's calling (name and relationship to patient) : [facility]  Best contact number: [phone] Provider they see: Ovidio KinSpenser  Reason for call: Need new Rx for patient     PRESCRIPTION REFILL ONLY  Name of prescription:  Basaglar  Pharmacy:[facility] [place] [place]  [address]

## 2018-08-30 ENCOUNTER — Encounter (INDEPENDENT_AMBULATORY_CARE_PROVIDER_SITE_OTHER): Payer: Self-pay | Admitting: Family

## 2018-08-30 ENCOUNTER — Ambulatory Visit (INDEPENDENT_AMBULATORY_CARE_PROVIDER_SITE_OTHER): Payer: BC Managed Care – PPO | Admitting: Family

## 2018-08-30 VITALS — BP 134/90 | HR 88 | Ht 66.61 in | Wt 161.6 lb

## 2018-08-30 DIAGNOSIS — R7309 Other abnormal glucose: Secondary | ICD-10-CM | POA: Diagnosis not present

## 2018-08-30 DIAGNOSIS — I10 Essential (primary) hypertension: Secondary | ICD-10-CM | POA: Diagnosis not present

## 2018-08-30 DIAGNOSIS — E063 Autoimmune thyroiditis: Secondary | ICD-10-CM

## 2018-08-30 DIAGNOSIS — R739 Hyperglycemia, unspecified: Secondary | ICD-10-CM

## 2018-08-30 DIAGNOSIS — E1065 Type 1 diabetes mellitus with hyperglycemia: Secondary | ICD-10-CM

## 2018-08-30 DIAGNOSIS — IMO0001 Reserved for inherently not codable concepts without codable children: Secondary | ICD-10-CM

## 2018-08-30 DIAGNOSIS — F54 Psychological and behavioral factors associated with disorders or diseases classified elsewhere: Secondary | ICD-10-CM

## 2018-08-30 LAB — TSH: TSH: 1.95 m[IU]/L (ref 0.40–4.50)

## 2018-08-30 LAB — POCT GLYCOSYLATED HEMOGLOBIN (HGB A1C): HEMOGLOBIN A1C: 8.1 % — AB (ref 4.0–5.6)

## 2018-08-30 LAB — POCT GLUCOSE (DEVICE FOR HOME USE): Glucose Fasting, POC: 149 mg/dL — AB (ref 70–99)

## 2018-08-30 LAB — T4, FREE: Free T4: 1.5 ng/dL (ref 0.8–1.8)

## 2018-08-30 NOTE — Progress Notes (Signed)
Pediatric Endocrinology Diabetes Consultation Follow-up Visit  Chad Holloway [facility] [birth year] [id number]  Chief Complaint: Follow-up type 1 diabetes   Loyola Mast, MD   HPI: Chad Holloway  is a 22 y.o. male presenting for follow-up of type 1 diabetes.   1. Chad Holloway was diagnosed with T1DM in 2000 at age 17 years. His first visit to our clinic was on 04/19/05 when he was referred to [place] by his PCP, Dr. Loyola Mast, for E&M of his T1DM.  In 2009 we converted him to a Medtronic Paradigm 722 insulin pump. Although his HbA1c values were generally better on the pump, there were times when he was so noncompliant that his HbA1c value rose to > 14%.  2. Since last visit to [facility] on 08/19, he has been well.  No ER visits or hospitalizations.   He has been well overall, graduated from college in May and will start his Master's program. He recently got diagnosed with strep throat, he was treated with antibiotics. However, the right side of his neck continued to swell so he was seen by ENT. 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   He feels like he is doing really well with his diabetes care overall. He is bolusing before eating which has been helpful. He is keeping a close eye on his blood sugars and changes his pump site sooner if he is running hyperglycemic. His Medtornic 530g insulin pump recently stopped working and he used MDI for a few days. He plans to get a new insulin pump, considering Tandem Tslim and Omnipod. Also wants to get a Dexcom CGM. Overall, he is happy with his blood sugars.   Taking 10 mg of Lisinopril per day. Also taking 25 mcg of Levothyroxine.   Insulin regimen: Basal Rates 12AM 1.05  5am 1.40  8am 1.30  2pm 1.25  8pm 1.05    Insulin to Carbohydrate Ratio 12AM  12  4pm 10  9pm 12          Insulin Sensitivity Factor 12AM 60  6am 40  9pm 60         Target Blood Glucose 12AM 140  6am 110  9pm 140          Hypoglycemia: Able to  feel low blood sugars.  No glucagon needed recently. Feels shaky and tired when low. Keeps glucose with him.  Insulin Pump download:   - Avg Bg 166. Checking 5 x per day   - Using 41 units per day.   - 70% basal and 30% bolus   - Entering 125 grams of carbs per day.  Med-alert ID: Not currently wearing.  Injection sites: butt and occasionally abdomen. Working on legs.  Annual labs due: 11/2018  Ophthalmology due: 2020    3. ROS: Greater than 10 systems reviewed with pertinent positives listed in HPI, otherwise neg. Constitutional: Good energy and appetite. Sleeping well.   Eyes: No changes in vision. Wears glasses. Denies blurry vision.  Ears/Nose/Mouth/Throat: No difficulty swallowing. Denies throat pain and neck pain. + swelling to right side of neck.  Cardiovascular: No palpitations. Denies chest pain and tachycardia Respiratory: No increased work of breathing. No SOB  Gastrointestinal: No constipation or diarrhea. No abdominal pain Genitourinary: No nocturia, no polyuria Musculoskeletal: No joint pain Neurologic: Normal sensation, no tremor Endocrine: No polydipsia.  No hyperpigmentation Psychiatric: Normal affect. Denies anxiety and depression. Currently seeing therapist at school [employer]  Past Medical History:   Past Medical History:  Diagnosis Date  . Diabetes mellitus type I (HCC)   . Hypothyroidism     Medications:  Outpatient Encounter Medications as of 08/30/2018  Medication Sig  . glucagon 1 MG injection Follow package directions for low blood sugar.  Marland Kitchen. glucose blood test strip Use as instructed. Check sugar 6 times per day. For use with linking meter for Medtroninc pump  . insulin aspart (NOVOLOG) 100 UNIT/ML injection Use 300 units in insulin pump every 48 hours  . Insulin Pen Needle 31G X 5 MM MISC BD Pen Needles- brand specific Inject insulin via insulin pen 7 x daily  . levothyroxine (SYNTHROID, LEVOTHROID) 25 MCG tablet Take 1 tablet 5 days a week and 1.5 tablets  2 days a week  . lisinopril (PRINIVIL,ZESTRIL) 10 MG tablet Take 1 tablet (10 mg total) by mouth daily.  . Continuous Blood Gluc Sensor (DEXCOM G6 SENSOR) MISC 1 each by Does not apply route as directed. 1 sensor every 10 days (Patient not taking: Reported on 08/30/2018)  . Continuous Blood Gluc Transmit (DEXCOM G6 TRANSMITTER) MISC 1 each by Does not apply route every 3 (three) months. (Patient not taking: Reported on 08/30/2018)  . insulin aspart (NOVOLOG FLEXPEN) 100 UNIT/ML FlexPen Use up to 50 units daily (Patient not taking: Reported on 05/17/2016)  . insulin aspart (NOVOLOG FLEXPEN) 100 UNIT/ML FlexPen Inject up to 50 units per day (Patient not taking: Reported on 08/30/2018)  . Insulin Glargine (BASAGLAR KWIKPEN) 100 UNIT/ML SOPN Use up to 50 units daily. (Patient not taking: Reported on 08/30/2018)  . Insulin Glargine (LANTUS SOLOSTAR) 100 UNIT/ML Solostar Pen Inject up to 50 units per day (Patient not taking: Reported on 08/30/2018)   No facility-administered encounter medications on file as of 08/30/2018.     Allergies: No Known Allergies  Surgical History: No past surgical history on file.  Family History:  Family History  Problem Relation Age of Onset  . Diabetes Mother   . Hypertension Mother   . Thyroid disease Mother   . Obesity Mother   . Thyroid disease Father   . Diabetes Brother       Social History: Lives with roommates in college. Getting an apartment.  Currently in Senior year of college. [employer] in [place]  Physical Exam:  Vitals:   08/30/18 0955  BP: 134/90  Pulse: 88  Weight: 161 lb 9.6 oz (73.3 kg)  Height: 5' 6.61" (1.692 m)   BP 134/90   Pulse 88   Ht 5' 6.61" (1.692 m)   Wt 161 lb 9.6 oz (73.3 kg)   BMI 25.60 kg/m  Body mass index: body mass index is 25.6 kg/m. Growth percentile SmartLinks can only be used for patients less than 21 years old.  Ht Readings from Last 3 Encounters:  08/30/18 5' 6.61" (1.692 m)  04/03/18 5'  6.54" (1.69 m)  11/30/17 5' 6.3" (1.684 m)   Wt Readings from Last 3 Encounters:  08/30/18 161 lb 9.6 oz (73.3 kg)  04/03/18 169 lb 9.6 oz (76.9 kg)  11/30/17 165 lb (74.8 kg)   PHYSICAL EXAM:  General: Well developed, well nourished male in no acute distress.  Alert and oriented.  Head: Normocephalic, atraumatic.   Eyes:  Pupils equal and round. EOMI.  Sclera white.  No eye drainage.   Ears/Nose/Mouth/Throat: Nares patent, no nasal drainage.  Normal dentition, mucous membranes moist.  Neck: supple, no cervical lymphadenopathy, no thyromegaly + brachial cyst.  Cardiovascular: regular rate, normal S1/S2, no murmurs Respiratory: No increased work  of breathing.  Lungs clear to auscultation bilaterally.  No wheezes. Abdomen: soft, nontender, nondistended. Normal bowel sounds.  No appreciable masses  Extremities: warm, well perfused, cap refill < 2 sec.   Musculoskeletal: Normal muscle mass.  Normal strength Skin: warm, dry.  No rash or lesions. Neurologic: alert and oriented, normal speech, no tremor   Labs:   Lab Results  Component Value Date   HGBA1C 8.1 (A) 08/30/2018   Results for orders placed or performed in visit on 08/30/18  POCT Glucose (Device for Home Use)  Result Value Ref Range   Glucose Fasting, POC 149 (A) 70 - 99 mg/dL   POC Glucose    POCT glycosylated hemoglobin (Hb A1C)  Result Value Ref Range   Hemoglobin A1C 8.1 (A) 4.0 - 5.6 %   HbA1c POC (<> result, manual entry)     HbA1c, POC (prediabetic range)     HbA1c, POC (controlled diabetic range)      Assessment/Plan: Payne is a 22 y.o. male with uncontrolled type 1 diabetes on Medtronic 530g insulin pump. He has continued to make improvements to diabetes care. Checking blood sugar more frequently and bolusing before eating. Will benefit from CGM therapy. His hemoglobin A1c is 8.1% which is higher then the ADA goal of <7% for adults. Clinically euthyroid on 25 mcg of Levothyroxine per day. Blood pressure stable  on 10 mg of Lisinopril   1. DM w/o complication type I, uncontrolled (HCC)/Hyperglycemia/hyperglycemia/Elevated a1c - reviewed insulin pump and glucose download. Discussed patterns and trends.  - Discussed available insulin pumps and gave hand outs for Tandem Tslim and Omnipod  - Also encouraged CGm therapy and discussed benefits.  - Advised to bolus at least 15 minutes before eating.  - During surgery for cyst removal: Decrease basal by 10-15% prior to surgery. After surgery he may run higher due to healing/stress response. Can set increase temp basal if needed.  - pOCT glucose and hemoglobin A1c    2. Essential Hypertension  - 10 mg of Lisinopril daily   3. Hypothyroidism, acquired, autoimmune - 25 mcg of Synthroid per day  - TSh and FT4 ordered today.   4. Insulin Pump Titration No changes today. Pump is in place.    5. Maladaptive Behaviors  - Discussed barriers to care and gave praise for improvements he has made.  - Discussed balancing diabetes care with school/activites and work  - Answered questions.    Follow-up:   3 months. Use mychart as needed   I have spent >40  minutes with >50% of time in counseling, education and instruction. When a patient is on insulin, intensive monitoring of blood glucose levels is necessary to avoid hyperglycemia and hypoglycemia. Severe hyperglycemia/hypoglycemia can lead to hospital admissions and be life threatening.    Gretchen Short, MD Pediatric Specialist  [address]  [place], [address]  Tele: [phone]

## 2018-08-30 NOTE — Patient Instructions (Signed)
-  Always have fast sugar with you in case of low blood sugar (glucose tabs, regular juice or soda, candy) -Always wear your ID that states you have diabetes -Always bring your meter to your visit -Call/Email if you want to review blood sugars  - After surgery, blood sugars may run high> ok to use temp basal of +15%   - A1c is 8.1%   - Continue your lisinopril and levothyroxine   - Keep up the good work.

## 2018-09-04 ENCOUNTER — Telehealth (INDEPENDENT_AMBULATORY_CARE_PROVIDER_SITE_OTHER): Payer: Self-pay | Admitting: Family

## 2018-09-04 ENCOUNTER — Telehealth (INDEPENDENT_AMBULATORY_CARE_PROVIDER_SITE_OTHER): Payer: Self-pay | Admitting: *Deleted

## 2018-09-04 NOTE — Telephone Encounter (Signed)
Spoke to mother, she advises that the [place]NT in [place] has ordered a cat scan at [facility], the cyst has enlarged. They also recommend that [name] get [place] in [place], [place]. Jenne PaneBates. I advised that they needed to get a PCP involved since this is out side of what we do. [name] will advise on glucose issues should it be needed. She advises she will take care of everything and keep is informed of the situation.

## 2018-09-04 NOTE — Telephone Encounter (Signed)
°  Who's calling (name and relationship to patient) : Chad Holloway   Best contact number: [phone]  Provider they see: Ovidio KinSpenser  Reason for call:  Mom called to see which ENT [name] recommends in [place], and the ENT in [place] is trying to get a C-scan set up for [name] as soon as possible, ENT says the bump has gotten bigger. Needs to get into an ENT ASAP.  Blood work results, wondering if [name] noticed anything there that show any signs of Cancer, mom is concerned about it being Cancer. Also, wondering if [name] got his fllu shot. Says is urgent, and would like a call back because it's been going on 2 months, taking anti-biotics and nothing has changed. Would like for this all to be handled during his school break which ends beginning of January.   PRESCRIPTION REFILL ONLY  Name of prescription:  Pharmacy:

## 2018-09-04 NOTE — Telephone Encounter (Signed)
See note from today

## 2018-09-05 DIAGNOSIS — R221 Localized swelling, mass and lump, neck: Secondary | ICD-10-CM | POA: Diagnosis present

## 2018-09-09 ENCOUNTER — Other Ambulatory Visit: Payer: Self-pay | Admitting: Otolaryngology

## 2018-09-09 DIAGNOSIS — R221 Localized swelling, mass and lump, neck: Secondary | ICD-10-CM

## 2018-09-13 ENCOUNTER — Ambulatory Visit
Admission: RE | Admit: 2018-09-13 | Discharge: 2018-09-13 | Disposition: A | Discharge status: Home / Self Care | Payer: BC Managed Care – PPO | Source: Ambulatory Visit | Attending: Otolaryngology | Admitting: Otolaryngology

## 2018-09-13 DIAGNOSIS — R221 Localized swelling, mass and lump, neck: Secondary | ICD-10-CM

## 2018-09-13 MED ORDER — IOPAMIDOL (ISOVUE-300) INJECTION 61%: 75.0000 mL | Freq: Once | INTRAVENOUS | Status: DC | PRN

## 2018-09-13 MED ORDER — IOPAMIDOL (ISOVUE-300) INJECTION 61%
75.0000 mL | Freq: Once | INTRAVENOUS | Status: AC | PRN
  Administered 2018-09-13: 75 mL via INTRAVENOUS

## 2018-09-14 ENCOUNTER — Observation Stay (HOSPITAL_COMMUNITY): Payer: BC Managed Care – PPO | Admitting: Anesthesiology

## 2018-09-14 ENCOUNTER — Encounter (HOSPITAL_COMMUNITY)
Admission: EM | Disposition: A | Discharge status: Home / Self Care | Payer: Self-pay | Source: Home / Self Care | Attending: Emergency Medicine

## 2018-09-14 ENCOUNTER — Telehealth (INDEPENDENT_AMBULATORY_CARE_PROVIDER_SITE_OTHER): Payer: Self-pay | Admitting: "Endocrinology

## 2018-09-14 ENCOUNTER — Encounter (HOSPITAL_COMMUNITY): Payer: Self-pay | Admitting: Emergency Medicine

## 2018-09-14 ENCOUNTER — Observation Stay (HOSPITAL_COMMUNITY)
Admission: EM | Admit: 2018-09-14 | Discharge: 2018-09-15 | Disposition: A | Discharge status: Home / Self Care | Payer: BC Managed Care – PPO | Attending: Otolaryngology | Admitting: Otolaryngology

## 2018-09-14 DIAGNOSIS — E063 Autoimmune thyroiditis: Secondary | ICD-10-CM | POA: Diagnosis not present

## 2018-09-14 DIAGNOSIS — Z794 Long term (current) use of insulin: Secondary | ICD-10-CM | POA: Diagnosis not present

## 2018-09-14 DIAGNOSIS — Z79890 Hormone replacement therapy (postmenopausal): Secondary | ICD-10-CM | POA: Diagnosis not present

## 2018-09-14 DIAGNOSIS — Z9641 Presence of insulin pump (external) (internal): Secondary | ICD-10-CM | POA: Insufficient documentation

## 2018-09-14 DIAGNOSIS — I10 Essential (primary) hypertension: Secondary | ICD-10-CM | POA: Diagnosis not present

## 2018-09-14 DIAGNOSIS — L0211 Cutaneous abscess of neck: Principal | ICD-10-CM | POA: Insufficient documentation

## 2018-09-14 DIAGNOSIS — E109 Type 1 diabetes mellitus without complications: Secondary | ICD-10-CM | POA: Diagnosis not present

## 2018-09-14 DIAGNOSIS — R221 Localized swelling, mass and lump, neck: Secondary | ICD-10-CM | POA: Diagnosis present

## 2018-09-14 HISTORY — DX: Essential (primary) hypertension: I10

## 2018-09-14 HISTORY — PX: INCISION AND DRAINAGE ABSCESS: SHX5864

## 2018-09-14 LAB — CBC WITH DIFFERENTIAL/PLATELET
Abs Immature Granulocytes: 0.02 10*3/uL (ref 0.00–0.07)
Basophils Absolute: 0 10*3/uL (ref 0.0–0.1)
Basophils Relative: 1 %
Eosinophils Absolute: 0.1 10*3/uL (ref 0.0–0.5)
Eosinophils Relative: 2 %
HCT: 45.7 % (ref 39.0–52.0)
Hemoglobin: 15.5 g/dL (ref 13.0–17.0)
Immature Granulocytes: 0 %
LYMPHS PCT: 28 %
Lymphs Abs: 1.5 10*3/uL (ref 0.7–4.0)
MCH: 28.5 pg (ref 26.0–34.0)
MCHC: 33.9 g/dL (ref 30.0–36.0)
MCV: 84 fL (ref 80.0–100.0)
MONO ABS: 0.6 10*3/uL (ref 0.1–1.0)
Monocytes Relative: 11 %
Neutro Abs: 3.1 10*3/uL (ref 1.7–7.7)
Neutrophils Relative %: 58 %
Platelets: 236 10*3/uL (ref 150–400)
RBC: 5.44 MIL/uL (ref 4.22–5.81)
RDW: 12.4 % (ref 11.5–15.5)
WBC: 5.2 10*3/uL (ref 4.0–10.5)
nRBC: 0 % (ref 0.0–0.2)

## 2018-09-14 LAB — BASIC METABOLIC PANEL
Anion gap: 10 (ref 5–15)
BUN: 12 mg/dL (ref 6–20)
CO2: 23 mmol/L (ref 22–32)
Calcium: 9.8 mg/dL (ref 8.9–10.3)
Chloride: 105 mmol/L (ref 98–111)
Creatinine, Ser: 1.03 mg/dL (ref 0.61–1.24)
GFR calc Af Amer: >60 mL/min (ref >60)
Glucose, Bld: 235 mg/dL — ABNORMAL HIGH (ref 70–99)
Potassium: 4.3 mmol/L (ref 3.5–5.1)
Sodium: 138 mmol/L (ref 135–145)

## 2018-09-14 LAB — GLUCOSE, CAPILLARY
Glucose-Capillary: 229 mg/dL — ABNORMAL HIGH (ref 70–99)
Glucose-Capillary: 157 mg/dL — ABNORMAL HIGH (ref 70–99)
Glucose-Capillary: 136 mg/dL — ABNORMAL HIGH (ref 70–99)

## 2018-09-14 LAB — MRSA PCR SCREENING: MRSA by PCR: NEGATIVE

## 2018-09-14 SURGERY — INCISION AND DRAINAGE, ABSCESS
Anesthesia: General | Site: Neck | Laterality: Right

## 2018-09-14 MED ORDER — OXYCODONE HCL 5 MG PO TABS: 5.0000 mg | ORAL_TABLET | Freq: Once | ORAL | Status: DC | PRN

## 2018-09-14 MED ORDER — ONDANSETRON HCL 4 MG/2ML IJ SOLN
INTRAMUSCULAR | Status: DC | PRN
  Administered 2018-09-14: 4 mg via INTRAVENOUS

## 2018-09-14 MED ORDER — PROPOFOL 10 MG/ML IV BOLUS
INTRAVENOUS | Status: AC
  Filled 2018-09-14: qty 20

## 2018-09-14 MED ORDER — DEXCOM G6 SENSOR MISC: 1.0000 | Status: DC

## 2018-09-14 MED ORDER — OXYCODONE HCL 5 MG/5ML PO SOLN: 5.0000 mg | Freq: Once | ORAL | Status: DC | PRN

## 2018-09-14 MED ORDER — NALOXONE HCL 0.4 MG/ML IJ SOLN
INTRAMUSCULAR | Status: DC | PRN
  Administered 2018-09-14 (×2): 40 ug via INTRAVENOUS

## 2018-09-14 MED ORDER — FENTANYL CITRATE (PF) 100 MCG/2ML IJ SOLN
25.0000 ug | INTRAMUSCULAR | Status: DC | PRN
  Administered 2018-09-14: 25 ug via INTRAVENOUS

## 2018-09-14 MED ORDER — HYDROMORPHONE HCL 1 MG/ML IJ SOLN: 0.2500 mg | INTRAMUSCULAR | Status: DC | PRN

## 2018-09-14 MED ORDER — FENTANYL CITRATE (PF) 100 MCG/2ML IJ SOLN
INTRAMUSCULAR | Status: AC
  Filled 2018-09-14: qty 2

## 2018-09-14 MED ORDER — LIDOCAINE-EPINEPHRINE 1 %-1:100000 IJ SOLN
INTRAMUSCULAR | Status: AC
  Filled 2018-09-14: qty 1

## 2018-09-14 MED ORDER — PROMETHAZINE HCL 25 MG/ML IJ SOLN: 6.2500 mg | INTRAMUSCULAR | Status: DC | PRN

## 2018-09-14 MED ORDER — DEXCOM G6 TRANSMITTER MISC: 1.0000 | Status: DC

## 2018-09-14 MED ORDER — MIDAZOLAM HCL 5 MG/5ML IJ SOLN
INTRAMUSCULAR | Status: DC | PRN
  Administered 2018-09-14: 1 mg via INTRAVENOUS

## 2018-09-14 MED ORDER — PROPOFOL 10 MG/ML IV BOLUS
INTRAVENOUS | Status: DC | PRN
  Administered 2018-09-14: 200 mg via INTRAVENOUS

## 2018-09-14 MED ORDER — LEVOTHYROXINE SODIUM 25 MCG PO TABS
25.0000 ug | ORAL_TABLET | ORAL | Status: DC
  Administered 2018-09-15: 25 ug via ORAL
  Filled 2018-09-14: qty 1

## 2018-09-14 MED ORDER — MIDAZOLAM HCL 2 MG/2ML IJ SOLN
INTRAMUSCULAR | Status: AC
  Filled 2018-09-14: qty 2

## 2018-09-14 MED ORDER — PHENYLEPHRINE HCL 10 MG/ML IJ SOLN
INTRAMUSCULAR | Status: DC | PRN
  Administered 2018-09-14: 40 ug via INTRAVENOUS

## 2018-09-14 MED ORDER — FENTANYL CITRATE (PF) 250 MCG/5ML IJ SOLN
INTRAMUSCULAR | Status: AC
  Filled 2018-09-14: qty 5

## 2018-09-14 MED ORDER — LACTATED RINGERS IV SOLN
INTRAVENOUS | Status: DC | PRN
  Administered 2018-09-14: 19:00:00 via INTRAVENOUS

## 2018-09-14 MED ORDER — ONDANSETRON HCL 4 MG/2ML IJ SOLN
4.0000 mg | Freq: Four times a day (QID) | INTRAMUSCULAR | Status: DC | PRN
  Administered 2018-09-15: 4 mg via INTRAVENOUS
  Filled 2018-09-14: qty 2

## 2018-09-14 MED ORDER — ONDANSETRON 4 MG PO TBDP: 4.0000 mg | ORAL_TABLET | Freq: Four times a day (QID) | ORAL | Status: DC | PRN

## 2018-09-14 MED ORDER — LACTATED RINGERS IV SOLN: INTRAVENOUS | Status: DC

## 2018-09-14 MED ORDER — FENTANYL CITRATE (PF) 100 MCG/2ML IJ SOLN
INTRAMUSCULAR | Status: DC | PRN
  Administered 2018-09-14 (×2): 50 ug via INTRAVENOUS

## 2018-09-14 MED ORDER — MEPERIDINE HCL 50 MG/ML IJ SOLN: 6.2500 mg | INTRAMUSCULAR | Status: DC | PRN

## 2018-09-14 MED ORDER — LEVOTHYROXINE SODIUM 75 MCG PO TABS: 37.5000 ug | ORAL_TABLET | ORAL | Status: DC

## 2018-09-14 MED ORDER — SODIUM CHLORIDE 0.9 % IV SOLN: INTRAVENOUS | Status: DC

## 2018-09-14 MED ORDER — DEXTROSE-NACL 5-0.45 % IV SOLN
INTRAVENOUS | Status: DC
  Administered 2018-09-14 – 2018-09-15 (×2): via INTRAVENOUS

## 2018-09-14 MED ORDER — GLUCOSE BLOOD VI STRP: 1.0000 | ORAL_STRIP | Status: DC | PRN

## 2018-09-14 MED ORDER — SODIUM CHLORIDE 0.9 % IV SOLN
3.0000 g | Freq: Four times a day (QID) | INTRAVENOUS | Status: DC
  Administered 2018-09-14 – 2018-09-15 (×4): 3 g via INTRAVENOUS
  Filled 2018-09-14 (×5): qty 3

## 2018-09-14 MED ORDER — LISINOPRIL 10 MG PO TABS
10.0000 mg | ORAL_TABLET | Freq: Every day | ORAL | Status: DC
  Administered 2018-09-15: 10 mg via ORAL
  Filled 2018-09-14: qty 1

## 2018-09-14 MED ORDER — 0.9 % SODIUM CHLORIDE (POUR BTL) OPTIME
TOPICAL | Status: DC | PRN
  Administered 2018-09-14: 1000 mL

## 2018-09-14 SURGICAL SUPPLY — 45 items
ATTRACTOMAT 16X20 MAGNETIC DRP (DRAPES) IMPLANT
BLADE SURG 15 STRL LF DISP TIS (BLADE) ×1 IMPLANT
BLADE SURG 15 STRL SS (BLADE) ×2
CANISTER SUCT 3000ML PPV (MISCELLANEOUS) ×3 IMPLANT
CLEANER TIP ELECTROSURG 2X2 (MISCELLANEOUS) IMPLANT
CONT SPEC 4OZ CLIKSEAL STRL BL (MISCELLANEOUS) ×3 IMPLANT
COVER SURGICAL LIGHT HANDLE (MISCELLANEOUS) ×3 IMPLANT
COVER WAND RF STERILE (DRAPES) ×3 IMPLANT
CRADLE DONUT ADULT HEAD (MISCELLANEOUS) ×3 IMPLANT
DRAIN CHANNEL 15F RND FF W/TCR (WOUND CARE) IMPLANT
DRAIN PENROSE 1/2X12 LTX STRL (WOUND CARE) ×3 IMPLANT
DRAPE HALF SHEET 40X57 (DRAPES) IMPLANT
DRAPE INCISE 13X13 STRL (DRAPES) IMPLANT
ELECT COATED BLADE 2.86 ST (ELECTRODE) ×3 IMPLANT
ELECT REM PT RETURN 9FT ADLT (ELECTROSURGICAL) ×3
ELECTRODE REM PT RTRN 9FT ADLT (ELECTROSURGICAL) ×1 IMPLANT
EVACUATOR SILICONE 100CC (DRAIN) IMPLANT
GAUZE 4X4 16PLY RFD (DISPOSABLE) IMPLANT
GAUZE SPONGE 4X4 12PLY STRL (GAUZE/BANDAGES/DRESSINGS) IMPLANT
GAUZE SPONGE 4X4 12PLY STRL LF (GAUZE/BANDAGES/DRESSINGS) ×3 IMPLANT
GLOVE SS BIOGEL STRL SZ 7.5 (GLOVE) ×1 IMPLANT
GLOVE SUPERSENSE BIOGEL SZ 7.5 (GLOVE) ×2
GOWN STRL REUS W/ TWL LRG LVL3 (GOWN DISPOSABLE) ×2 IMPLANT
GOWN STRL REUS W/TWL LRG LVL3 (GOWN DISPOSABLE) ×4
KIT BASIN OR (CUSTOM PROCEDURE TRAY) ×3 IMPLANT
KIT TURNOVER KIT B (KITS) ×3 IMPLANT
NEEDLE HYPO 25GX1X1/2 BEV (NEEDLE) IMPLANT
NS IRRIG 1000ML POUR BTL (IV SOLUTION) ×3 IMPLANT
PAD ARMBOARD 7.5X6 YLW CONV (MISCELLANEOUS) ×6 IMPLANT
PENCIL FOOT CONTROL (ELECTRODE) ×3 IMPLANT
SOL PREP POV-IOD 4OZ 10% (MISCELLANEOUS) ×3 IMPLANT
SPONGE INTESTINAL PEANUT (DISPOSABLE) IMPLANT
STAPLER VISISTAT 35W (STAPLE) IMPLANT
SUCTION FRAZIER TIP 8 FR DISP (SUCTIONS)
SUCTION TUBE FRAZIER 8FR DISP (SUCTIONS) IMPLANT
SUT CHROMIC 4 0 PS 2 18 (SUTURE) IMPLANT
SUT ETHILON 3 0 PS 1 (SUTURE) IMPLANT
SUT ETHILON 5 0 P 3 18 (SUTURE)
SUT NYLON ETHILON 5-0 P-3 1X18 (SUTURE) IMPLANT
SUT SILK 2 0 PERMA HAND 18 BK (SUTURE) IMPLANT
TAPE CLOTH SURG 4X10 WHT LF (GAUZE/BANDAGES/DRESSINGS) ×3 IMPLANT
TOWEL OR 17X24 6PK STRL BLUE (TOWEL DISPOSABLE) IMPLANT
TRAY ENT MC OR (CUSTOM PROCEDURE TRAY) ×3 IMPLANT
WATER STERILE IRR 1000ML POUR (IV SOLUTION) ×3 IMPLANT
YANKAUER SUCT BULB TIP NO VENT (SUCTIONS) IMPLANT

## 2018-09-14 NOTE — ED Triage Notes (Addendum)
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 Pt has had this since thanksgiving and it is getting larger. Has been on 3 antibiotics, currently taking clindamycin. ENT advised he come to ED for possible drainage or surgical removal. Pt is insulin pump dependent type 1 diabetes and also has Hashimoto's. Pt advised to come here by MD Jearld FentonByers

## 2018-09-14 NOTE — H&P (Signed)
Chad Holloway is an 22 y.o. male.   Chief Complaint: Right neck mass HPI: History of right neck mass since Thanksgiving.  He has juvenile onset diabetes.  He has been treated with several rounds of antibiotics.  A fine-needle aspiration was performed on December 19 but the results are not back yet.  The material was purulence when it was aspirated.  His CT scan that was performed is consistent with a branchial cleft cyst that is infected.  He is slightly worse now and presents to the emergency room with increased swelling and slightly more pain.  Also some erythema over the skin that was not there previously.  He wants to have this drained at this point.  Past Medical History:  Diagnosis Date  . Diabetes mellitus type I (HCC)   . Hypertension   . Hypothyroidism     History reviewed. No pertinent surgical history.  Family History  Problem Relation Age of Onset  . Diabetes Mother   . Hypertension Mother   . Thyroid disease Mother   . Obesity Mother   . Thyroid disease Father   . Diabetes Brother    Social History:  reports that he has never smoked. He has never used smokeless tobacco. He reports that he does not drink alcohol or use drugs.  Allergies: No Known Allergies  (Not in a hospital admission)   Results for orders placed or performed during the hospital encounter of 09/14/18 (from the past 48 hour(s))  CBC with Differential/Platelet     Status: None   Collection Time: 09/14/18  1:59 PM  Result Value Ref Range   WBC 5.2 4.0 - 10.5 K/uL   RBC 5.44 4.22 - 5.81 MIL/uL   Hemoglobin 15.5 13.0 - 17.0 g/dL   HCT 16.145.7 09.639.0 - 04.552.0 %   MCV 84.0 80.0 - 100.0 fL   MCH 28.5 26.0 - 34.0 pg   MCHC 33.9 30.0 - 36.0 g/dL   RDW 40.912.4 81.111.5 - 91.415.5 %   Platelets 236 150 - 400 K/uL   nRBC 0.0 0.0 - 0.2 %   Neutrophils Relative % 58 %   Neutro Abs 3.1 1.7 - 7.7 K/uL   Lymphocytes Relative 28 %   Lymphs Abs 1.5 0.7 - 4.0 K/uL   Monocytes Relative 11 %   Monocytes Absolute 0.6 0.1 - 1.0 K/uL    Eosinophils Relative 2 %   Eosinophils Absolute 0.1 0.0 - 0.5 K/uL   Basophils Relative 1 %   Basophils Absolute 0.0 0.0 - 0.1 K/uL   Immature Granulocytes 0 %   Abs Immature Granulocytes 0.02 0.00 - 0.07 K/uL    Comment: Performed at [facility], [address] [place], [place] [address]  Basic metabolic panel     Status: Abnormal   Collection Time: 09/14/18  1:59 PM  Result Value Ref Range   Sodium 138 135 - 145 mmol/L   Potassium 4.3 3.5 - 5.1 mmol/L   Chloride 105 98 - 111 mmol/L   CO2 23 22 - 32 mmol/L   Glucose, Bld 235 (H) 70 - 99 mg/dL   BUN 12 6 - 20 mg/dL   Creatinine, Ser 6.211.03 0.61 - 1.24 mg/dL   Calcium 9.8 8.9 - 30.810.3 mg/dL   GFR calc non Af Amer >60 >60 mL/min   GFR calc Af Amer >60 >60 mL/min   Anion gap 10 5 - 15    Comment: Performed at [facility], [address] [place], [place] [address]  Ct Soft Tissue Neck W Contrast  Result Date: 09/13/2018 CLINICAL DATA:  RIGHT neck mass, pain and tenderness, slight redness. Recent aspiration of purulent material. EXAM: CT NECK WITH CONTRAST TECHNIQUE: Multidetector CT imaging of the neck was performed using the standard protocol following the bolus administration of intravenous contrast. CONTRAST:  75mL ISOVUE-300 IOPAMIDOL (ISOVUE-300) INJECTION 61% Creatinine was obtained on site at [facility] at [address] Results: Creatinine 1.1 mg/dL. COMPARISON:  None. FINDINGS: Pharynx and larynx: Unremarkable Salivary glands: No inflammation, mass, or stone. Thyroid: Normal. Lymph nodes: Reactive cervical lymph nodes RIGHT greater than LEFT. Vascular: Displaced carotid sheath vessels, no thrombosis. Limited intracranial: Negative. Visualized orbits: Negative. Mastoids and visualized paranasal sinuses: Clear. Skeleton: No worrisome osseous lesion. Upper chest: Negative. Other: There is a thick walled, peripherally enhancing, low-attenuation fluid collection in the RIGHT neck overlying the  sternocleidomastoid, approximate cross-section 37 x 14 x 41 mm. There is thickening of the skin and stranding of the adjacent subcutaneous fat. Small foci of cm sized similar attenuation fluid can be seen superior and inferior to the primary collection. 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 Small foci of additional abscess formation surrounding the infected cyst. A call has been placed to the ordering provider. Electronically Signed   By: Elsie StainJohn T Curnes M.D.   On: 09/13/2018 13:34    Review of Systems  Constitutional: Negative.   HENT: Negative.   Eyes: Negative.   Respiratory: Negative.   Cardiovascular: Negative.   Skin: Negative.     Blood pressure 126/75, pulse 83, temperature 98.7 F (37.1 C), resp. rate 18, height 5\' 7"  (1.702 m), weight 74.8 kg, SpO2 99 %. Physical Exam  Constitutional: He appears well-developed and well-nourished.  HENT:  Head: Normocephalic and atraumatic.  Mouth/Throat: Oropharynx is clear and moist.  Eyes: Pupils are equal, round, and reactive to light. Conjunctivae are normal.  Neck: Normal range of motion. Neck supple.  Large swelling in the right jugulodigastric region with erythema and slight punctum area of the skin overlying the mass.  The mass is approximately 4 cm.  It is mobile.  It is tender to palpation.  Cardiovascular: Normal rate.  Respiratory: Effort normal.  GI: Soft.  Musculoskeletal: Normal range of motion.     Assessment/Plan Right neck mass/abscess-the CT scan, fine-needle aspiration appearance, and clinical presentation all point to a infected neck mass.  We talked about the fact that we do not have the fine-needle aspiration results back yet.  It is possible this is a neoplasm.  Because it is worse in the above criteria he wants to proceed with  incision and drainage knowing the risk.  The mother agrees.  We discussed the incision and drainage including risk, benefits, and options.  All his questions were answered and consent was obtained.  Suzanna ObeyJohn Venancio Chenier, MD 09/14/2018, 2:52 PM

## 2018-09-14 NOTE — Anesthesia Procedure Notes (Deleted)
Performed by: Fread Kottke M, CRNA       

## 2018-09-14 NOTE — Transfer of Care (Signed)
Immediate Anesthesia Transfer of Care Note  Patient: Chad Holloway  Procedure(s) Performed: INCISION AND DRAINAGE ABSCESS (Right Neck)  Patient Location: PACU  Anesthesia Type:General  Level of Consciousness: awake, alert  and oriented  Airway & Oxygen Therapy: Patient Spontanous Breathing  Post-op Assessment: Report given to RN and Post -op Vital signs reviewed and stable  Post vital signs: Reviewed and stable  Last Vitals:  Vitals Value Taken Time  BP 117/77 09/14/2018  7:25 PM  Temp 36.7 C 09/14/2018  7:23 PM  Pulse 75 09/14/2018  7:30 PM  Resp 20 09/14/2018  7:30 PM  SpO2 98 % 09/14/2018  7:30 PM  Vitals shown include unvalidated device data.  Last Pain:  Vitals:   09/14/18 1720  TempSrc:   PainSc: 0-No pain         Complications: No apparent anesthesia complications

## 2018-09-14 NOTE — Progress Notes (Signed)
Patient was instructed to reduce basal rate to 50% when he was transported to OR. His mother was made aware too .

## 2018-09-14 NOTE — Progress Notes (Signed)
Pt admitted to floor with insulin pump and awaiting possible surgery this afternoon. Patients endocrinologist office contacted for insulin management .

## 2018-09-14 NOTE — Progress Notes (Signed)
Per Dr. Hart RochesterHollis instruct patient to turn of insulin pump. I instructed patient to turn pump off. Chanetta MarshallKiera, RN in PACU also notified that patient is a Type 1 DM and that pump has been turned off.

## 2018-09-14 NOTE — Progress Notes (Signed)
RN verified the presence of a signed informed consent that matches stated procedure by patient. Verified armband matches patient's stated name and birth date. Verified NPO status (0300 today) and that all jewelry, contact, glasses, dentures, and partials had been removed (if applicable). Patient has insulin pump Dr. Hart RochesterHollis aware.

## 2018-09-14 NOTE — Anesthesia Procedure Notes (Signed)
Performed by: Karianne Nogueira M, CRNA       

## 2018-09-14 NOTE — ED Notes (Signed)
Report called to rn on 6 n 

## 2018-09-14 NOTE — Anesthesia Preprocedure Evaluation (Addendum)
Anesthesia Evaluation  Patient identified by MRN, date of birth, ID band Patient awake    Reviewed: Allergy & Precautions, NPO status , Patient's Chart, lab work & pertinent test results  Airway Mallampati: I  TM Distance: >3 FB Neck ROM: Full    Dental  (+) Teeth Intact, Dental Advisory Given   Pulmonary neg pulmonary ROS,    breath sounds clear to auscultation       Cardiovascular hypertension, Pt. on medications  Rhythm:Regular Rate:Normal     Neuro/Psych negative neurological ROS     GI/Hepatic negative GI ROS, Neg liver ROS,   Endo/Other  diabetes, Type 1, Insulin DependentHypothyroidism   Renal/GU      Musculoskeletal negative musculoskeletal ROS (+)   Abdominal Normal abdominal exam  (+)   Peds  Hematology negative hematology ROS (+)   Anesthesia Other Findings   Reproductive/Obstetrics                            Lab Results  Component Value Date   WBC 5.2 09/14/2018   HGB 15.5 09/14/2018   HCT 45.7 09/14/2018   MCV 84.0 09/14/2018   PLT 236 09/14/2018   Lab Results  Component Value Date   CREATININE 1.03 09/14/2018   BUN 12 09/14/2018   NA 138 09/14/2018   K 4.3 09/14/2018   CL 105 09/14/2018   CO2 23 09/14/2018   No results found for: INR, PROTIME  Glucose: 235   Anesthesia Physical Anesthesia Plan  ASA: II  Anesthesia Plan: General   Post-op Pain Management:    Induction: Intravenous  PONV Risk Score and Plan: 3 and Ondansetron, Midazolam and Treatment may vary due to age or medical condition  Airway Management Planned: LMA  Additional Equipment: None  Intra-op Plan:   Post-operative Plan: Extubation in OR  Informed Consent: I have reviewed the patients History and Physical, chart, labs and discussed the procedure including the risks, benefits and alternatives for the proposed anesthesia with the patient or authorized representative who has  indicated his/her understanding and acceptance.   Dental advisory given  Plan Discussed with: CRNA  Anesthesia Plan Comments:       Anesthesia Quick Evaluation

## 2018-09-14 NOTE — Anesthesia Procedure Notes (Signed)
Procedure Name: LMA Insertion Date/Time: 09/14/2018 6:46 PM Performed by: Chad CaprioAuston, Chad Naves M, CRNA Pre-anesthesia Checklist: Patient identified, Emergency Drugs available, Suction available, Patient being monitored and Timeout performed Patient Re-evaluated:Patient Re-evaluated prior to induction Oxygen Delivery Method: Circle system utilized Preoxygenation: Pre-oxygenation with 100% oxygen Induction Type: IV induction Ventilation: Mask ventilation with difficulty LMA: LMA inserted LMA Size: 4.0 Number of attempts: 1 Placement Confirmation: breath sounds checked- equal and bilateral and positive ETCO2 Tube secured with: Tape Dental Injury: Teeth and Oropharynx as per pre-operative assessment

## 2018-09-14 NOTE — Telephone Encounter (Signed)
1. I received a page from Chad Holloway's nurse, Ms. Launa FlightJoann Hamze. 2. Subjective: Chad Holloway will be going to the OR soon for I&D of his neck mass. The nurse told me that Dr. Jearld FentonByers wanted to know if Chad Holloway can remain on his insulin pump during surgery. If so, how should the pump be managed. Chad Holloway has not yet had any pre-anesthetic medication.  3. Assessment: It is safe for Chad Holloway to remain on his insulin pump. In fact, it is good for him to do so. However, he needs less insulin during the perioperative period.  4. Plan:  A. I asked the nurse to ask Chad Holloway to set a temporary basal rate of 50% for the expected duration of the surgery and the recovery room period.   B. Assuming that the procedure will only be an I&D, when Chad Holloway can return to eating and drinking regularly, he can resume his usual basal rates. If more extensive surgery is needed, however, especially if he will not be mentally awake and alert, then other plans for his pump must be made.   C. Please call me if having any problems.  Molli KnockMichael Oswin Johal, MD, CDE Adult and Pediatric Endocrinology [phone]

## 2018-09-14 NOTE — Progress Notes (Signed)
Will send to OR. Hand off report given and OR made aware that patient is a Type I diabetic with Insulin still on.

## 2018-09-14 NOTE — Op Note (Signed)
Preop/postop diagnosis: Right neck abscess Procedure: Incision and drainage of right neck abscess Anesthesia: General Estimated blood loss: Less than 10 cc Indications: 22 year old with a right neck mass since Thanksgiving.  He had purulent material come out of the needle aspiration.  His CT scan is consistent with a abscess cavity of possible branchial cleft cyst.  He was informed the risk and benefits of the procedure and options were discussed all questions were answered and consent was obtained. Procedure: Patient was taken to the operating room placed in the supine position after general mask ventilation anesthesia was placed in the left gaze position prepped and draped in the usual sterile manner.  An incision was made along the mass in a skin crease with a 15 blade.  Dissection was carried out bluntly with a hemostat and immediately purulent material was encountered.  Anaerobic and aerobic cultures were taken.  A cavity was easily opened up and the material expressed.  A hemostat was used to gently probe the area to be sure all cavities were open.  The wound was then irrigated with saline.  A Penrose half-inch was placed and secured with a 3-0 nylon.  The patient was then awakened and brought to recovery in stable condition counts correct

## 2018-09-14 NOTE — ED Provider Notes (Signed)
[facility] EMERGENCY [facility] Provider Note   CSN: [id number] Arrival date & time: 09/14/18  1214     History   Chief Complaint No chief complaint on file.   HPI Annette StableLevi J Diguglielmo is a 22 y.o. male.  Patient is a 22 year old male with a history of diabetes type 1 who is on an insulin pump, hypertension, hypothyroidism who is presenting today with persistent swelling on the right side of his neck.  He states he had strep throat right before Thanksgiving and this area started after that.  It has continued to become more sore and larger since that time.  He saw Dr. Jearld FentonByers last week and a biopsy was attempted but only pus returned.  He had a CT scan done and they talked with Jearld FentonByers today and he stated that he had an infected brachial cleft cyst and instructed him to go to the emergency room.  Patient has not had anything to eat.  His insulin pump is currently on at this time.  He states that sugars have been running in the 200s which is pretty good for him.  Has not had fever or systemic illness.  No sore throat or difficulty swallowing.  The history is provided by the patient and a parent.    Past Medical History:  Diagnosis Date  . Diabetes mellitus type I (HCC)   . Hypertension   . Hypothyroidism     Patient Active Problem List   Diagnosis Date Noted  . Elevated hemoglobin A1c 11/30/2017  . Maladaptive health behaviors affecting medical condition 11/30/2017  . Insulin pump titration 11/30/2017  . Hypoglycemia associated with diabetes (HCC) 02/27/2012  . Hypothyroidism, acquired, autoimmune 02/27/2012  . Thyroiditis 02/27/2012  . Goiter 02/27/2012  . Hypertension 11/27/2011  . Type I (juvenile type) diabetes mellitus without mention of complication, uncontrolled 01/09/2011    History reviewed. No pertinent surgical history.      Home Medications    Prior to Admission medications   Medication Sig Start Date End Date Taking? Authorizing Provider    Continuous Blood Gluc Sensor (DEXCOM G6 SENSOR) MISC 1 each by Does not apply route as directed. 1 sensor every 10 days Patient not taking: Reported on 08/30/2018 04/03/18   Dessa PhiBadik, Jennifer, MD  Continuous Blood Gluc Transmit (DEXCOM G6 TRANSMITTER) MISC 1 each by Does not apply route every 3 (three) months. Patient not taking: Reported on 08/30/2018 04/03/18   Dessa PhiBadik, Jennifer, MD  glucagon 1 MG injection Follow package directions for low blood sugar. 04/03/18   Dessa PhiBadik, Jennifer, MD  glucose blood test strip Use as instructed. Check sugar 6 times per day. For use with linking meter for [employer] pump 04/03/18   Dessa PhiBadik, Jennifer, MD  insulin aspart (NOVOLOG FLEXPEN) 100 UNIT/ML FlexPen Use up to 50 units daily Patient not taking: Reported on 05/17/2016 03/06/16   David StallBrennan, Michael J, MD  insulin aspart (NOVOLOG FLEXPEN) 100 UNIT/ML FlexPen Inject up to 50 units per day Patient not taking: Reported on 08/30/2018 08/21/18   Gretchen ShortBeasley, [name], NP  insulin aspart (NOVOLOG) 100 UNIT/ML injection Use 300 units in insulin pump every 48 hours 04/03/18   Dessa PhiBadik, Jennifer, MD  Insulin Glargine (BASAGLAR KWIKPEN) 100 UNIT/ML SOPN Use up to 50 units daily. Patient not taking: Reported on 08/30/2018 08/21/18   Gretchen ShortBeasley, [name], NP  Insulin Glargine (LANTUS SOLOSTAR) 100 UNIT/ML Solostar Pen Inject up to 50 units per day Patient not taking: Reported on 08/30/2018 08/21/18   Gretchen ShortBeasley, [name], NP  Insulin Pen Needle 31G  X 5 MM MISC BD Pen Needles- brand specific Inject insulin via insulin pen 7 x daily 08/21/18   Gretchen Short, NP  levothyroxine (SYNTHROID, LEVOTHROID) 25 MCG tablet Take 1 tablet 5 days a week and 1.5 tablets 2 days a week 04/03/18   Dessa Phi, MD  lisinopril (PRINIVIL,ZESTRIL) 10 MG tablet Take 1 tablet (10 mg total) by mouth daily. 04/03/18   Dessa Phi, MD    Family History Family History  Problem Relation Age of Onset  . Diabetes Mother   . Hypertension Mother   . Thyroid disease Mother    . Obesity Mother   . Thyroid disease Father   . Diabetes Brother     Social History Social History   Tobacco Use  . Smoking status: Never Smoker  . Smokeless tobacco: Never Used  Substance Use Topics  . Alcohol use: No  . Drug use: No     Allergies   Patient has no known allergies.   Review of Systems Review of Systems  All other systems reviewed and are negative.    Physical Exam Updated Vital Signs BP (!) 145/95   Pulse 90   Temp 98.7 F (37.1 C)   Resp 18   Ht 5\' 7"  (1.702 m)   Wt 74.8 kg   SpO2 100%   BMI 25.84 kg/m   Physical Exam Vitals signs and nursing note reviewed.  Constitutional:      General: He is not in acute distress.    Appearance: Normal appearance. He is normal weight.  HENT:     Head: Normocephalic.     Nose: Nose normal.     Mouth/Throat:     Mouth: Mucous membranes are moist.     Pharynx: Oropharynx is clear. No oropharyngeal exudate or posterior oropharyngeal erythema.  Eyes:     Pupils: Pupils are equal, round, and reactive to light.  Neck:   Cardiovascular:     Rate and Rhythm: Normal rate.     Pulses: Normal pulses.  Pulmonary:     Effort: Pulmonary effort is normal.  Skin:    General: Skin is warm.     Capillary Refill: Capillary refill takes less than 2 seconds.  Neurological:     General: No focal deficit present.     Mental Status: He is alert. Mental status is at baseline.  Psychiatric:        Mood and Affect: Mood normal.      ED Treatments / Results  Labs (all labs ordered are listed, but only abnormal results are displayed) Labs Reviewed  CBC WITH DIFFERENTIAL/PLATELET  BASIC METABOLIC PANEL    EKG None  Radiology Ct Soft Tissue Neck W Contrast  Result Date: 09/13/2018 CLINICAL DATA:  RIGHT neck mass, pain and tenderness, slight redness. Recent aspiration of purulent material. EXAM: CT NECK WITH CONTRAST TECHNIQUE: Multidetector CT imaging of the neck was performed using the standard protocol  following the bolus administration of intravenous contrast. CONTRAST:  75mL ISOVUE-300 IOPAMIDOL (ISOVUE-300) INJECTION 61% Creatinine was obtained on site at [facility] at [address] Results: Creatinine 1.1 mg/dL. COMPARISON:  None. FINDINGS: Pharynx and larynx: Unremarkable Salivary glands: No inflammation, mass, or stone. Thyroid: Normal. Lymph nodes: Reactive cervical lymph nodes RIGHT greater than LEFT. Vascular: Displaced carotid sheath vessels, no thrombosis. Limited intracranial: Negative. Visualized orbits: Negative. Mastoids and visualized paranasal sinuses: Clear. Skeleton: No worrisome osseous lesion. Upper chest: Negative. Other: There is a thick walled, peripherally enhancing, low-attenuation fluid collection in the RIGHT neck  overlying the sternocleidomastoid, approximate cross-section 37 x 14 x 41 mm. There is thickening of the skin and stranding of the adjacent subcutaneous fat. Small foci of cm sized similar attenuation fluid can be seen superior and inferior to the primary collection. 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 Small foci of additional abscess formation surrounding the infected cyst. A call has been placed to the ordering provider. Electronically Signed   By: Elsie StainJohn T Curnes M.D.   On: 09/13/2018 13:34    Procedures Procedures (including critical care time)  Medications Ordered in ED Medications  0.9 %  sodium chloride infusion (has no administration in time range)     Initial Impression / Assessment and Plan / ED Course  I have reviewed the triage vital signs and the nursing notes.  Pertinent labs & imaging results that were available during my care of the patient were reviewed by me and considered in my medical decision making (see chart for details).      Patient is a 22 year old male presenting today with a CT finding consistent with an infected brachial cleft cyst in the right neck approximately 37 x 14 x 41 mm with a small focus of additional abscess formation surrounding the infected cyst.  Patient was sent here by Dr. Jearld FentonByers for further treatment.  He is not systemically ill at this time low suspicion for DKA and blood sugars have been running in the 200s which is normal for him.  Patient has been n.p.o.  IV placed and basic labs pending.  Will let Dr. Jearld FentonByers know the patient is here.  Final Clinical Impressions(s) / ED Diagnoses   Final diagnoses:  None    ED Discharge Orders    None       Gwyneth SproutPlunkett, Danity Schmelzer, MD 09/15/18 1207

## 2018-09-15 ENCOUNTER — Encounter (HOSPITAL_COMMUNITY): Payer: Self-pay | Admitting: Otolaryngology

## 2018-09-15 ENCOUNTER — Other Ambulatory Visit: Payer: Self-pay

## 2018-09-15 LAB — GLUCOSE, CAPILLARY: Glucose-Capillary: 167 mg/dL — ABNORMAL HIGH (ref 70–99)

## 2018-09-15 LAB — HIV ANTIBODY (ROUTINE TESTING W REFLEX): HIV Screen 4th Generation wRfx: NONREACTIVE

## 2018-09-15 MED ORDER — HYDROCODONE-ACETAMINOPHEN 5-325 MG PO TABS: 1.0000 | ORAL_TABLET | ORAL | 0 refills | Status: DC | PRN

## 2018-09-15 MED ORDER — HYDROCODONE-ACETAMINOPHEN 5-325 MG PO TABS
1.0000 | ORAL_TABLET | ORAL | Status: DC | PRN
  Administered 2018-09-15: 1 via ORAL
  Filled 2018-09-15: qty 1

## 2018-09-15 MED ORDER — MORPHINE SULFATE (PF) 2 MG/ML IV SOLN: 2.0000 mg | INTRAVENOUS | Status: DC | PRN

## 2018-09-15 MED ORDER — INSULIN PUMP
Freq: Three times a day (TID) | SUBCUTANEOUS | Status: DC
  Administered 2018-09-15: 08:00:00 via SUBCUTANEOUS
  Filled 2018-09-15: qty 1

## 2018-09-15 NOTE — Progress Notes (Signed)
Blood sugar now at 96. Patient appears non-distressed with no s/s for hypoglycemia

## 2018-09-15 NOTE — Progress Notes (Signed)
Called to patientS room. He states incision  dressing feels like its  wet. RN did see a small amount of serosanguinous drainage noted to right neck dressing site. Dressing reinforced with ABD pad and tape.

## 2018-09-15 NOTE — Anesthesia Postprocedure Evaluation (Signed)
Anesthesia Post Note  Patient: Chad Holloway  Procedure(s) Performed: INCISION AND DRAINAGE ABSCESS (Right Neck)     Patient location during evaluation: PACU Anesthesia Type: General Level of consciousness: awake and alert Pain management: pain level controlled Vital Signs Assessment: post-procedure vital signs reviewed and stable Respiratory status: spontaneous breathing, nonlabored ventilation, respiratory function stable and patient connected to nasal cannula oxygen Cardiovascular status: blood pressure returned to baseline and stable Postop Assessment: no apparent nausea or vomiting Anesthetic complications: no    Last Vitals:  Vitals:   09/14/18 2039 09/14/18 2045  BP: 136/83 136/83  Pulse: 82 77  Resp:  16  Temp:  36.5 C  SpO2: 98% 98%    Last Pain:  Vitals:   09/14/18 2045  TempSrc: Oral  PainSc:                  Shelton SilvasKevin D Bryar Dahms

## 2018-09-15 NOTE — Progress Notes (Signed)
Pt is discharged to go home.  Discharge instructions and prescription information given to patient and mother.  Pt expressed understanding of wound care and pain management.

## 2018-09-15 NOTE — Progress Notes (Signed)
patients blood sugar 64 . Patient given 4oz of coke cola. He also request graham  cracker . RN will recheck blood sugar as per protocol

## 2018-09-15 NOTE — Discharge Summary (Signed)
Physician Discharge Summary  Patient ID: Chad Holloway J Melgarejo MRN: [id number] DOB/AGE: 21/12/1995 21 y.o.  Admit date: 09/14/2018 Discharge date: 09/15/2018  Admission Diagnoses: Right neck abscess  Discharge Diagnoses: Same Active Problems:   Neck abscess   Discharged Condition: good  Hospital Course: Patient admitted for incision and drainage of a mass in the right neck that was worsening with erythema of the skin.  He underwent an incision and drainage with purulent material expressed and cultures were taken.  Postoperative day 1 he is doing well and moderate amount of drainage still on the gauze.  The Penrose drain was in place.  He was discharged to home to follow-up on Tuesday in the office to have the drain removed.  They will keep track of the amount of drainage coming out based on the gauze appearance.  Consults: None  Significant Diagnostic Studies: none  Treatments: As above  Discharge Exam: Blood pressure (!) 133/95, pulse 72, temperature 97.6 F (36.4 C), temperature source Oral, resp. rate 16, height 5\' 7"  (1.702 m), weight 74.8 kg, SpO2 98 %. Wake and alert.  Neck is decreased in size and the drain is in place and active.  There is no significant skin cellulitis.  Heart is regular.  Lungs are normal effort.  Abdomen is soft.  Extremities are without swelling or tenderness  Disposition:    He is discharged to home.  Follow-up on Tuesday.  Continue his home clindamycin.  Call if there is any increasing swelling, redness, or increasing pain.   Signed: Suzanna ObeyJohn Bernadette Armijo 09/15/2018, 10:54 AM

## 2018-09-16 ENCOUNTER — Telehealth (INDEPENDENT_AMBULATORY_CARE_PROVIDER_SITE_OTHER): Payer: Self-pay | Admitting: *Deleted

## 2018-09-16 ENCOUNTER — Encounter (INDEPENDENT_AMBULATORY_CARE_PROVIDER_SITE_OTHER): Payer: Self-pay | Admitting: *Deleted

## 2018-09-16 NOTE — Telephone Encounter (Signed)
Returned TC to mother, advised that we can send the letter of medical necessity but we do not have a fax #.

## 2018-09-16 NOTE — Telephone Encounter (Signed)
°  Who's calling (name and relationship to patient) : Romana JuniperEllen Deese - Mom   Best contact number: [phone]  Provider they see: Gretchen ShortSpenser Beasley    Reason for call:  Mom called in to speak with Lorena about Pre-authorization and medical necessity for [name]'s insulin pump. Mom needs a call back as soon as possible to see what she needs to do on her part.

## 2018-09-16 NOTE — Telephone Encounter (Signed)
Received email.

## 2018-09-16 NOTE — Telephone Encounter (Signed)
Mom called and wanted to let Era BumpersLorena know that she sent the email.

## 2018-09-17 ENCOUNTER — Telehealth (INDEPENDENT_AMBULATORY_CARE_PROVIDER_SITE_OTHER): Payer: Self-pay | Admitting: *Deleted

## 2018-09-17 LAB — ACID FAST SMEAR (AFB): ACID FAST SMEAR - AFSCU2: NEGATIVE

## 2018-09-17 LAB — ACID FAST SMEAR (AFB, MYCOBACTERIA)

## 2018-09-17 NOTE — Telephone Encounter (Signed)
°  Who's calling (name and relationship to patient) : Chad Holloway, mom  Best contact number: [phone]  Provider they see: Question for Lorena  Reason for call: Got a text from [employer] stating that they received a statement of medical necessity but they need a letter of medical necessity. Mom doesn't know what the difference is, and would like for us to help figure it out and send them what they need.    PRESCRIPTION REFILL ONLY  Name of prescription:  Pharmacy:

## 2018-09-17 NOTE — Telephone Encounter (Signed)
Mom sent message through e-mail. [facility]. Copy in Chart.

## 2018-09-19 LAB — AEROBIC/ANAEROBIC CULTURE W GRAM STAIN (SURGICAL/DEEP WOUND): Culture: NO GROWTH

## 2018-09-26 NOTE — Telephone Encounter (Signed)
Who's calling (name and relationship to patient) :  Best contact number:  Provider they see:  Reason for call: Caller [facility] at [phone]. Requesting to speak with on call provider. Needing assistance with the patient's insulin pump and is schedule for surgery in 2 hours.   Call ID: [phone]  Dr Fransico MichaelBrennan on call charted patient information.    PRESCRIPTION REFILL ONLY  Name of prescription:  Pharmacy:

## 2018-10-16 LAB — FUNGUS CULTURE WITH STAIN

## 2018-10-16 LAB — FUNGUS CULTURE RESULT

## 2018-10-16 LAB — FUNGAL ORGANISM REFLEX

## 2018-10-29 LAB — ACID FAST CULTURE WITH REFLEXED SENSITIVITIES (MYCOBACTERIA)

## 2018-10-29 LAB — ACID FAST CULTURE WITH REFLEXED SENSITIVITIES: ACID FAST CULTURE - AFSCU3: NEGATIVE

## 2018-11-29 ENCOUNTER — Ambulatory Visit (INDEPENDENT_AMBULATORY_CARE_PROVIDER_SITE_OTHER): Payer: BC Managed Care – PPO | Admitting: Family

## 2018-12-05 ENCOUNTER — Encounter (INDEPENDENT_AMBULATORY_CARE_PROVIDER_SITE_OTHER): Payer: Self-pay | Admitting: *Deleted

## 2019-01-31 ENCOUNTER — Encounter (INDEPENDENT_AMBULATORY_CARE_PROVIDER_SITE_OTHER): Payer: Self-pay

## 2019-02-03 ENCOUNTER — Encounter (INDEPENDENT_AMBULATORY_CARE_PROVIDER_SITE_OTHER): Payer: Self-pay | Admitting: Family

## 2019-02-03 ENCOUNTER — Other Ambulatory Visit: Payer: Self-pay

## 2019-02-03 ENCOUNTER — Ambulatory Visit (INDEPENDENT_AMBULATORY_CARE_PROVIDER_SITE_OTHER): Payer: BC Managed Care – PPO | Admitting: Family

## 2019-02-03 DIAGNOSIS — R739 Hyperglycemia, unspecified: Secondary | ICD-10-CM | POA: Diagnosis not present

## 2019-02-03 DIAGNOSIS — E063 Autoimmune thyroiditis: Secondary | ICD-10-CM | POA: Diagnosis not present

## 2019-02-03 DIAGNOSIS — F54 Psychological and behavioral factors associated with disorders or diseases classified elsewhere: Secondary | ICD-10-CM

## 2019-02-03 DIAGNOSIS — E1065 Type 1 diabetes mellitus with hyperglycemia: Secondary | ICD-10-CM | POA: Diagnosis not present

## 2019-02-03 DIAGNOSIS — I10 Essential (primary) hypertension: Secondary | ICD-10-CM

## 2019-02-03 DIAGNOSIS — IMO0001 Reserved for inherently not codable concepts without codable children: Secondary | ICD-10-CM

## 2019-02-03 NOTE — Progress Notes (Signed)
  This is a Pediatric Specialist E-Visit follow up consult provided via, [employer] [facility] and their parent/guardian consented to an E-Visit consult today.  Location of patient: Chad Holloway is at home Location of provider: Crist Infante is at home office Patient was referred by Loyola Mast, MD   The following participants were involved in this E-Visit:Jaime Slemons, RMA Gretchen Short, FNP [facility]-patient  Chief Complain/ Reason for E-Visit today: Type 1 follow up  Total time on call: THis visit lasted >25 minutes. More then 50% of the visit was devoted to counseling.  Follow up: 3 months.

## 2019-02-03 NOTE — Patient Instructions (Signed)
-  Always have fast sugar with you in case of low blood sugar (glucose tabs, regular juice or soda, candy) -Always wear your ID that states you have diabetes -Always bring your meter to your visit -Call/Email if you want to review blood sugars   

## 2019-02-03 NOTE — Progress Notes (Signed)
Pediatric Endocrinology Diabetes Consultation Follow-up Visit  Chanel Hovey [facility] May 26, 1996 [id number]  Chief Complaint: Follow-up type 1 diabetes   Loyola Mast, MD   HPI: Kiefer  is a 23 y.o. male presenting for follow-up of type 1 diabetes.   1. Pat was diagnosed with T1DM in 2000 at age 51 years. His first visit to our clinic was on 04/19/05 when he was referred to [place] by his PCP, Dr. Loyola Mast, for E&M of his T1DM.  In 2009 we converted him to a Medtronic Paradigm 722 insulin pump. Although his HbA1c values were generally better on the pump, there were times when he was so noncompliant that his HbA1c value rose to > 14%.  2. Since last visit to [facility] on 12/19, he has been well.  No ER visits or hospitalizations.   He reports that things are going well. He had surgery on his neck in January to remove abscess, no further problems. He ordered a Tslim insulin pump and Dexcom CGm but is waiting to have training done at our office. Reports that when quarantine first started he was snacking a lot and running higher but has improved now. Using Medtronic insulin pump. Reports checking blood sugar at least 4 x per day.   Taking 10 mg of Lisinopril per day. Also taking 25 mcg of Levothyroxine.   Insulin regimen: Basal Rates 12AM 1.05  5am 1.40  8am 1.30  2pm 1.25  8pm 1.05    Insulin to Carbohydrate Ratio 12AM  12  4pm 10  9pm 12          Insulin Sensitivity Factor 12AM 60  6am 40  9pm 60         Target Blood Glucose 12AM 140  6am 110  9pm 140          Hypoglycemia: Able to feel low blood sugars.  No glucagon needed recently. Feels shaky and tired when low. Keeps glucose with him.  Insulin Pump download:   - Reviewed via webex  Med-alert ID: Not currently wearing.  Injection sites: butt and occasionally abdomen. Working on legs.  Annual labs due: 11/2018  Ophthalmology due: 2020    3. ROS: Greater than 10 systems reviewed with pertinent positives listed in HPI,  otherwise neg. Constitutional: Sleeping well. Good energy and appetite.  Eyes: No changes in vision. Wears glasses. Denies blurry vision.  Ears/Nose/Mouth/Throat: No difficulty swallowing. Denies throat pain and neck pain.  Cardiovascular: No palpitations. Denies chest pain and tachycardia Respiratory: No increased work of breathing. No SOB  Gastrointestinal: No constipation or diarrhea. No abdominal pain Genitourinary: No nocturia, no polyuria Musculoskeletal: No joint pain Neurologic: Normal sensation, no tremor Endocrine: No polydipsia.  No hyperpigmentation Psychiatric: Normal affect. Denies anxiety and depression. Currently seeing therapist at school [employer]  Past Medical History:   Past Medical History:  Diagnosis Date  . Diabetes mellitus type I (HCC)   . Hypertension   . Hypothyroidism     Medications:  Outpatient Encounter Medications as of 02/03/2019  Medication Sig  . Continuous Blood Gluc Sensor (DEXCOM G6 SENSOR) MISC 1 each by Does not apply route as directed. 1 sensor every 10 days (Patient not taking: Reported on 08/30/2018)  . Continuous Blood Gluc Transmit (DEXCOM G6 TRANSMITTER) MISC 1 each by Does not apply route every 3 (three) months. (Patient not taking: Reported on 08/30/2018)  . glucagon 1 MG injection Follow package directions for low blood sugar.  Marland Kitchen glucose blood test strip Use as instructed. Check sugar 6  times per day. For use with linking meter for Medtroninc pump  . HYDROcodone-acetaminophen (NORCO/VICODIN) 5-325 MG tablet Take 1-2 tablets by mouth every 4 (four) hours as needed for moderate pain.  Marland Kitchen insulin aspart (NOVOLOG FLEXPEN) 100 UNIT/ML FlexPen Use up to 50 units daily (Patient not taking: Reported on 05/17/2016)  . insulin aspart (NOVOLOG FLEXPEN) 100 UNIT/ML FlexPen Inject up to 50 units per day (Patient not taking: Reported on 08/30/2018)  . insulin aspart (NOVOLOG) 100 UNIT/ML injection Use 300 units in insulin pump every 48 hours  . Insulin  Glargine (BASAGLAR KWIKPEN) 100 UNIT/ML SOPN Use up to 50 units daily. (Patient not taking: Reported on 08/30/2018)  . Insulin Glargine (LANTUS SOLOSTAR) 100 UNIT/ML Solostar Pen Inject up to 50 units per day (Patient not taking: Reported on 08/30/2018)  . Insulin Pen Needle 31G X 5 MM MISC BD Pen Needles- brand specific Inject insulin via insulin pen 7 x daily  . levothyroxine (SYNTHROID, LEVOTHROID) 25 MCG tablet Take 1 tablet 5 days a week and 1.5 tablets 2 days a week (Patient taking differently: Take 25-37.5 mcg by mouth See admin instructions. Take 1 tablet ( ) all days except on Wed and Fri take 1.5 tablets (37.5))  . lisinopril (PRINIVIL,ZESTRIL) 10 MG tablet Take 1 tablet (10 mg total) by mouth daily.   No facility-administered encounter medications on file as of 02/03/2019.     Allergies: No Known Allergies  Surgical History: Past Surgical History:  Procedure Laterality Date  . INCISION AND DRAINAGE ABSCESS Right 09/14/2018   Procedure: INCISION AND DRAINAGE ABSCESS;  Surgeon: Suzanna Obey, MD;  Location[facility] OR;  Service: ENT;  Laterality: Right;    Family History:  Family History  Problem Relation Age of Onset  . Diabetes Mother   . Hypertension Mother   . Thyroid disease Mother   . Obesity Mother   . Thyroid disease Father   . Diabetes Brother       Social History: Lives with roommates in college. Getting an apartment.  Currently in Senior year of college. [employer] in [place]  Physical Exam:  There were no vitals filed for this visit. There were no vitals taken for this visit. Body mass index: body mass index is unknown because there is no height or weight on file. Growth percentile SmartLinks can only be used for patients less than 12 years old.  Ht Readings from Last 3 Encounters:  09/14/18  (1.702 m)  08/30/18 5' 6.61" (1.692 m)  04/03/18 5' 6.54" (1.69 m)   Wt Readings from Last 3 Encounters:  09/14/18 165 lb (74.8 kg)  08/30/18 161  lb 9.6 oz (73.3 kg)  04/03/18 169 lb 9.6 oz (76.9 kg)   PHYSICAL EXAM:  General: Well developed, well nourished male in no acute distress.  Alert and oriented.  Head: Normocephalic, atraumatic.   Eyes:  Pupils equal and round. EOMI.  Sclera white.  No eye drainage.   + glasses  Ears/Nose/Mouth/Throat: Nares patent, no nasal drainage.  Normal dentition, mucous membranes moist.  Neck: supple,  no thyromegaly  Cardiovascular: No cyanosis.  Respiratory: No increased work of breathing.   Skin: warm, dry.  No rash or lesions. Neurologic: alert and oriented, normal speech, no tremor   Labs:     Assessment/Plan: Kirill is a 23 y.o. male with uncontrolled type 1 diabetes on Medtronic 530g insulin pump. Doing well overall with diabetes control. Will benefit from starting closed loop therapy with Dexcom and Tandem Tslim. Clinically euthyroid on 25  mcg of levothyroxine per day.   1. DM w/o complication type I, uncontrolled (HCC)/Hyperglycemia/hyperglycemia/Elevated a1c - Reviewed glucose trends and patterns.  - Advised to bolus at least 15 minutes before eating  - Discussed carb counting.  - Rotate pump sites to prevent scar tissue.  - Discussed transition to closed loop insulin pump and possible benefits.  - Discussed signs and symptoms of hypoglycemia. Keep glucose available.  - wear medical alert Id at all times.   2. Essential Hypertension  - 10 mg of Lisinopril daily. Discussed importance.   3. Hypothyroidism, acquired, autoimmune - 25 mcg of Synthroid per day   4. Insulin Pump Titration No changes today. Pump is in place.    5. Maladaptive Behaviors  - Praise given for improvements.  - Answered questions.   Follow-up:   3 months. Use mychart as needed    When a patient is on insulin, intensive monitoring of blood glucose levels is necessary to avoid hyperglycemia and hypoglycemia. Severe hyperglycemia/hypoglycemia can lead to hospital admissions and be life threatening.     Gretchen ShortSpenser Mehtaab Mayeda, MD Pediatric Specialist  [address]  [place], [id number]  Tele: [phone]

## 2019-02-12 IMAGING — CT CT NECK W/ CM
2 of 3 series · 8 of 14 positions shown, 9 images · IV contrast (iopamidol)
Comparison: None.

CLINICAL DATA: RIGHT neck mass, pain and tenderness, slight
redness. Recent aspiration of purulent material.

EXAM:
CT NECK WITH CONTRAST
TECHNIQUE: Multidetector CT imaging of the neck was performed using the
standard protocol following the bolus administration of intravenous
contrast.
CONTRAST:  75mL YI129M-VLL IOPAMIDOL (YI129M-VLL) INJECTION 61%
Creatinine was obtained on site at [HOSPITAL] at [HOSPITAL].
Results: Creatinine 1.1 mg/dL.

[Series 2: neck · axial · 0.44mm/px · z∈[-252,-88]mm · 4 of 138 slices shown, 5 images]
[im 28/138  soft-tissue]
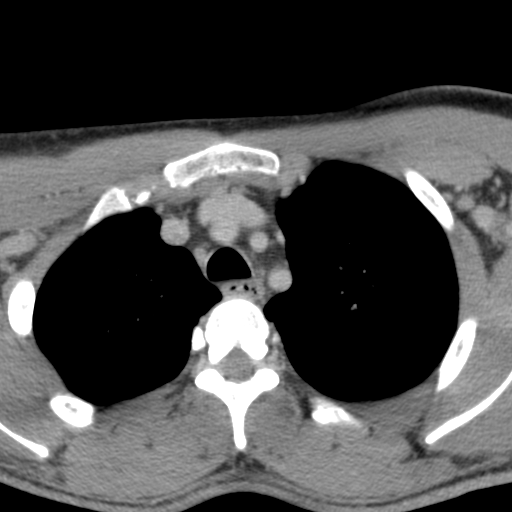
[im 28/138  bone]
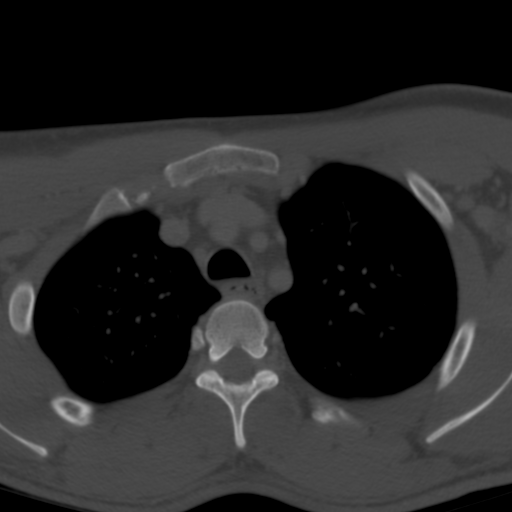
[im 55/138  bone]
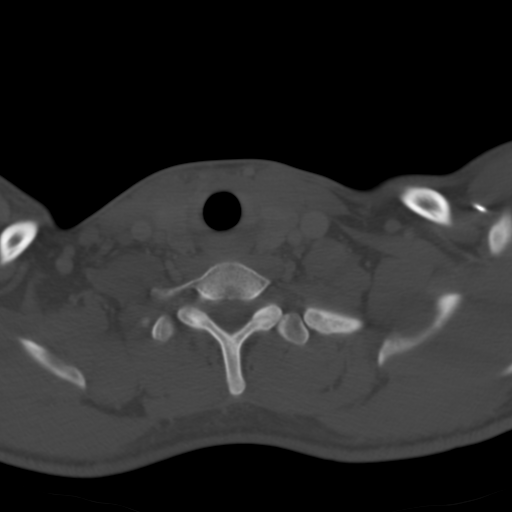
[im 83/138  bone]
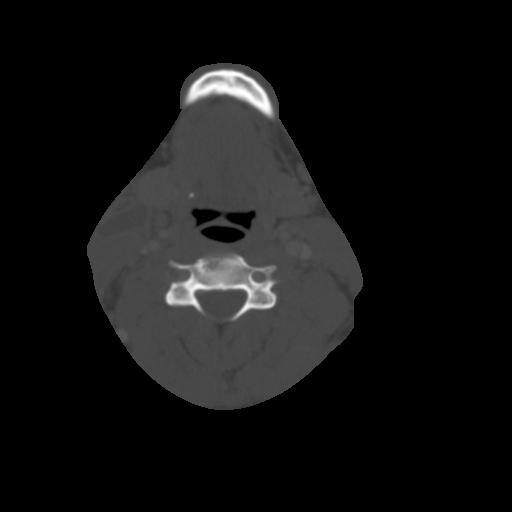
[im 110/138  bone]
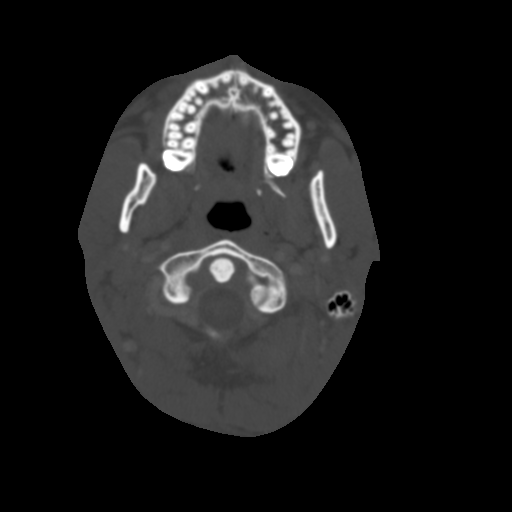

[Series 7: angled axial-oropharynx · axial · 0.39mm/px · z∈[-263,-100]mm · 4 of 138 slices shown]
[im 28/138  bone]
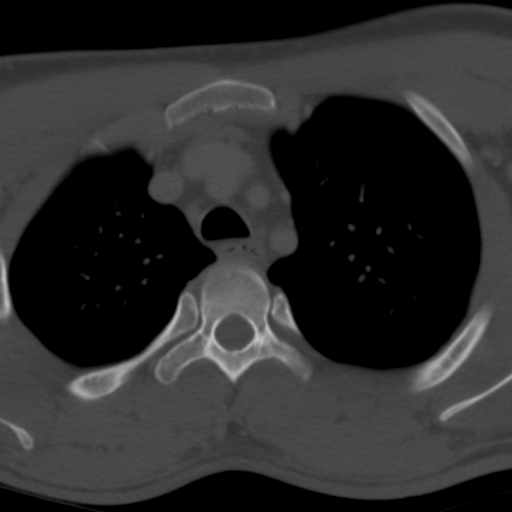
[im 55/138  bone]
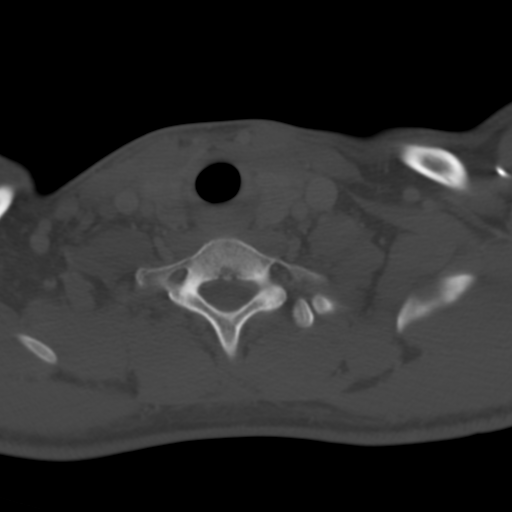
[im 83/138  bone]
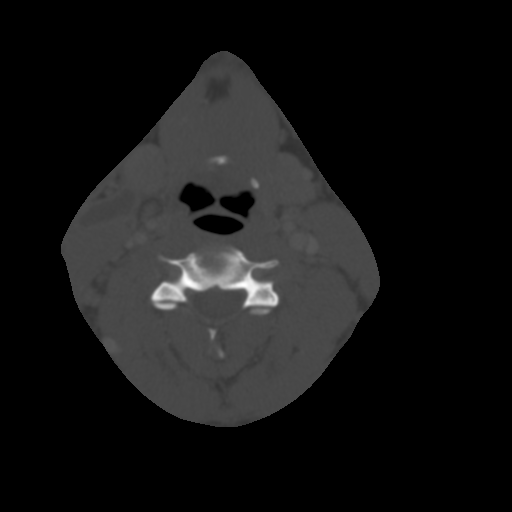
[im 110/138  bone]
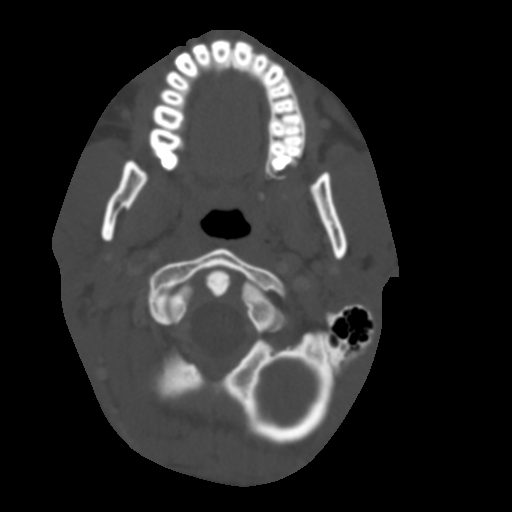

[8 of 14 positions shown; findings below may reference images not displayed]

FINDINGS: Pharynx and larynx: Unremarkable

Salivary glands: No inflammation, mass, or stone.

Thyroid: Normal.

Lymph nodes: Reactive cervical lymph nodes RIGHT greater than LEFT.

Vascular: Displaced carotid sheath vessels, no thrombosis.

Limited intracranial: Negative.

Visualized orbits: Negative.

Mastoids and visualized paranasal sinuses: Clear.

Skeleton: No worrisome osseous lesion.

Upper chest: Negative.

Other: There is a thick walled, peripherally enhancing,
low-attenuation fluid collection in the RIGHT neck overlying the
sternocleidomastoid, approximate cross-section 37 x 14 x 41 mm.
There is thickening of the skin and stranding of the adjacent
subcutaneous fat. Small foci of cm sized similar attenuation fluid
can be seen superior and inferior to the primary collection.
Findings are consistent with an infected branchial cleft cyst,
possible surrounding daughter abscesses.
IMPRESSION: Findings consistent with an infected branchial cleft cyst in the
RIGHT neck, approximate cross-section 37 x 14 x 41 mm. Small foci of
additional abscess formation surrounding the infected cyst.

A call has been placed to the ordering provider.

## 2019-02-25 ENCOUNTER — Other Ambulatory Visit: Payer: Self-pay

## 2019-02-25 ENCOUNTER — Ambulatory Visit (INDEPENDENT_AMBULATORY_CARE_PROVIDER_SITE_OTHER): Payer: BC Managed Care – PPO | Admitting: *Deleted

## 2019-02-25 DIAGNOSIS — IMO0001 Reserved for inherently not codable concepts without codable children: Secondary | ICD-10-CM

## 2019-02-25 DIAGNOSIS — E1065 Type 1 diabetes mellitus with hyperglycemia: Secondary | ICD-10-CM | POA: Diagnosis not present

## 2019-02-25 NOTE — Progress Notes (Signed)
T-slim and Dexcom Start  This is a Pediatric Specialist E-Visit follow up consult provided via [facility]  consented to an E-Visit consult today.  Location of patient: Chad Holloway is at home Location of provider: [name] Eaton F,RN, CDE is at [place] office  Patient was referred by Delrae Alfred, DO   The following participants were involved in this E-Visit: Darlis Loan and [name] Eaton, RN, CDE  Chief Complain/ Reason for E-Visit today: Tslim and Dexcom start  Total time on call: 105 mins  Chad Holloway was diagnosed with type 1 diabetes at the age of 3 years and was on the Medtronic's insulin pump 530G, he is ready to transfer his insulin pump settings to the new T-slim Control IQ and start on the Dexcom CGM. We started with the Dexcom CGM.  Review indications for use, contraindications, warnings and precautions of Dexcom CGM.  Please remove the Dexcom CGM sensor before any X-ray or CT scan or MRI procedures.    Demonstrated and showed patient to enter blood glucose readings and adjusting the lows and the high alerts on Dexcom app on smart phone.  Customize the Dexcom software features and settings based on the provider and patient's needs.    Sensor settings: High Alert                    On       250 mg/dL High repeat                 On       3 hours Rise rate                      Off   Low Alert                     On       75 mg/dL Low Repeat                 On       15 mins Fall Rate                      On   Urgent Low soon         On       20 mins Urgent Low                  On       55 mg/dL    Signal loss                   On       20 mins No readings                 On       20 mins   Showed and demonstrated patient how to apply a demo Dexcom CGM sensor,  Patient verbalized understanding the steps then proceeded to apply the sensor on.  Patient chose Left Upper quadrant, cleaned the area using alcohol,  Then applied adhesive in a circular motion,  Applied applicator and  inserted the sensor.  Patient tolerated very well the sensor insertion,  Patient started CGM on phone app and the insulin pump, was able to pair transmitter and sensor.  The patient should be within 20 feet of the receiver so the transmitter can communicate to the phone app and the insulin pump.   Pump overview: Touch screen and general navigation -Screen  On (wake) Quick bolus button -Screen lock- turns off pump screen after each interaction -Touch screen-turns off after 3 accidental screen taps -Home screen and home "T" button -Status, bolus and Options button -My pump screen -Keypad screens numbers and letters -Importance of Active confirmation screens -Icons and symbols on touchscreen   Personal Profiles  -Creating a new Personal Profile (Basal rates, insulin sensitivity, IC ratio and BG target) - Edit and review, Active, Duplicate, Delete and renaming a Personal Profile  Alert Settings: -Reminders: Low BG, High BG, after Bolus BG, missed bolus -Alerts: Low insulin, auto off  Pump Settings -Quick Bolus: grams or units increments -Pump Volume: Low, Med, High, or vibrate -Screen Options: Screen Timeout, feature lock  -time and date (importance for accuracy of settings and data)  Pump Info  SN- [id number]  Insulin pump settings:  Time Basal Rate Correction factor Carb ratio Target BG   12a 1.05 60 mg/dL 12 160 mg/dL  5a 1.40 60 mg/dL 12 140 mg/dL  6a 1.40 40 mg/dL 12  110 mg/dL  8a 1.30 40 mg/dL 12 110 mg/dL  2p 1.25 40 mg /dL 12 110 mg/dL  4p 1.25 40 mg/dL 10 110 mg/dL  8p 1.05 40 mg/dL 10 110 mg/dL  9p 1.05  60 mg/dL 12 140 mg/dL   Total 28.95 Units      Duration of insulin   3 hours     Maximum Bolus 12 Units     Carb (for calculation) On      Low Insulin Alert On- 20 Units      Auto Off Off     Quick Bolus Off     Control IQ On        Loading cartridge -Change cartridge: avoid changing at bedtime, use room temperature insulin, fill syringe, and remove bubbles  prior to filling cartridge. -Fill Cartridge (min 95 units and max 300 units) remove air, check for leaks, and connect to infusion set -fill Tubing (Ensure disconnect from site. Check for leaks) - Fill Cannula -Site reminder -fill estimate volume - Do not add or remove insulin after the Load sequence -removal and disposal of used cartridge and infusion set.    My CGM (if applicable) -Entering transmitter ID, entering sensor code, starting sensor session - 2 hour warm up period - Sensor alerts: high/Lows, rise/fall, end session, set volume - Out of range alerts: must be turned on in order to optimize the safety and performance of Basal IQ feature - CGM graph (change display timeline) and trend arrows  - Optimize connectivity between pump and sensor (pump screen facing out)  Pump/CGM history - Delivery summary, total daily dose, bolus, load BG, alerts and alarms - Session and calibration, alerts and error, complete  Delivering Boluses:  - Standard food bolus, adding multiple carbs, cancelling bolus - 0.05 minimum unit bolus 25 units maximum bolus - Entering a BG value, correction bolus, food bolus with correction - Above/Below Bg target and IOB- Bolus calculator Algorithm - Extended Bolus - On  - Quick Bolus Off  Safety: - Importance of backup plan and supplies to carry at all times: insulin syringe or pen and BG meter (Insulin pump Emergency kit) in case of emergency - Stop and resume insulin delivery - Aseptic/clean technique - Check Bg's daily if not using CGM - Hazard associated with small part and exposure to electromagnetic radiation or MRI - Reminders Low Bg, and High BG retest) site changes or follow DKA protocols - Tandem insulin pump SN warranty info,  24 hour/7 days Technical support [phone]  Control IQ Technology: - Uses CMG values to predict sensor glucose 30 minutes into future suspends, increases ( stops) insulin delivery if predicted valued < 80  mg/dL - Suspends (stops) insulin delivery if actual sensor glucose value is <80 mg/dL - Basal rate will automatically resume when CGM values begin to rise - Maximum Time of insulin suspension is 2 hours out of any 2.5 hr period - Basal insulin is either delivering or suspended not adjusted - Control IQ feature does not replace active diabetes management; treatment of hypoglycemia may need to be adjusted. Discuss changes with provider.  Patient verbalized readiness to start insulin pump.  Followed instructions in insulin pump how to change a cartridge. Filled Cartridge with 300 units, after removing air from cartridge.  Filled tubing and attached cartridge to insulin pump.  Parent inserted cannula on Left Upper Quadrant, patient tolerated very well. Checked Blood sugar, which required a correction.    Assessment/ Plan: Patient participated with hands on training and asked appropriate questions.  Patient was able to enter insulin pump and sensor settings to new Tandem T-slim insulin pump with no problems. Patient tolerated very well the cannula and the sensor insertion with no problems. Patient has not registered his insulin pump under T:Connect Please call us if you have any questions or concerns regarding your diabetes.        If any technical questions regarding your insulin pump and or CGM, please call the company Tandem and or Dexcom.

## 2019-02-26 ENCOUNTER — Encounter (INDEPENDENT_AMBULATORY_CARE_PROVIDER_SITE_OTHER): Payer: Self-pay | Admitting: *Deleted

## 2019-03-06 ENCOUNTER — Encounter (INDEPENDENT_AMBULATORY_CARE_PROVIDER_SITE_OTHER): Payer: Self-pay | Admitting: *Deleted

## 2019-04-09 ENCOUNTER — Telehealth (INDEPENDENT_AMBULATORY_CARE_PROVIDER_SITE_OTHER): Payer: Self-pay | Admitting: Family

## 2019-04-09 NOTE — Telephone Encounter (Signed)
Paperwork placed in [employer], labels also printed.

## 2019-04-09 NOTE — Telephone Encounter (Signed)
Mom came in with [facility] paperwork to be filled out for Ayub, will fax when completed to [facility] Mom requested we mail her the hard copy when completed.  [address]  [place], [place] [address]

## 2019-04-09 NOTE — Telephone Encounter (Addendum)
Mom came into office to drop off DMV paperwork, requested we

## 2019-04-25 ENCOUNTER — Telehealth (INDEPENDENT_AMBULATORY_CARE_PROVIDER_SITE_OTHER): Payer: Self-pay | Admitting: Family

## 2019-04-25 NOTE — Telephone Encounter (Signed)
Spoke to mother, advised DMV paperwork was mailed, faxed to the [facility] and a copy to batch scan for his chart.

## 2019-04-25 NOTE — Telephone Encounter (Signed)
°  Who's calling (name and relationship to patient) : Dorian Pod (mother) Best contact number: [phone] Provider they see: Hedda Slade Reason for call: Mother called to follow up on [facility] paperwork. She stated she has not received her copy yet and wanted to make sure that the paperwork had been sent to the [facility]. Mom would like a return call regarding this.

## 2019-05-09 ENCOUNTER — Ambulatory Visit (INDEPENDENT_AMBULATORY_CARE_PROVIDER_SITE_OTHER): Payer: BC Managed Care – PPO | Admitting: Family

## 2019-05-17 ENCOUNTER — Telehealth: Payer: Self-pay | Admitting: "Endocrinology

## 2019-05-17 ENCOUNTER — Other Ambulatory Visit (INDEPENDENT_AMBULATORY_CARE_PROVIDER_SITE_OTHER): Payer: Self-pay | Admitting: Pediatric Endocrinology

## 2019-05-17 DIAGNOSIS — IMO0001 Reserved for inherently not codable concepts without codable children: Secondary | ICD-10-CM

## 2019-05-17 NOTE — Telephone Encounter (Signed)
22. Mother had me Paged. Anthonymichael is out of insulin for his pump.  2. When I returned her call within one minute, she was not available. I left a voicemail message that I would contact his pharmacy and submit a new prescription.  3. Because Meng has two pharmacies listed, I called in the prescription to the [facility] in [place], which is the most recent pharmacy listed for Edu. I left a second voicemail on the mother's phone with that information.  Tillman Sers, Md, CDE

## 2019-05-22 NOTE — Telephone Encounter (Signed)
[facility] Call Call HC:[phone]

## 2019-06-27 ENCOUNTER — Other Ambulatory Visit: Payer: Self-pay

## 2019-06-27 ENCOUNTER — Encounter (INDEPENDENT_AMBULATORY_CARE_PROVIDER_SITE_OTHER): Payer: Self-pay | Admitting: Family

## 2019-06-27 ENCOUNTER — Ambulatory Visit (INDEPENDENT_AMBULATORY_CARE_PROVIDER_SITE_OTHER): Payer: BC Managed Care – PPO | Admitting: Family

## 2019-06-27 DIAGNOSIS — R7309 Other abnormal glucose: Secondary | ICD-10-CM

## 2019-06-27 DIAGNOSIS — I10 Essential (primary) hypertension: Secondary | ICD-10-CM | POA: Diagnosis not present

## 2019-06-27 DIAGNOSIS — E1065 Type 1 diabetes mellitus with hyperglycemia: Secondary | ICD-10-CM

## 2019-06-27 DIAGNOSIS — F54 Psychological and behavioral factors associated with disorders or diseases classified elsewhere: Secondary | ICD-10-CM | POA: Diagnosis not present

## 2019-06-27 DIAGNOSIS — E063 Autoimmune thyroiditis: Secondary | ICD-10-CM

## 2019-06-27 NOTE — Progress Notes (Signed)
This is a Pediatric Specialist E-Visit follow up consult provided via  WebEx Ziyad Dyar [facility] consented to an E-Visit consult today.  Location of patient: Trung is at work  Location of provider: Crist Infante- C is at office.  Patient was referred by Eulis Foster, DO   The following participants were involved in this E-Visit: Pascal and Landra Howze (list of participants and their roles)  Chief Complain/ Reason for E-Visit today: T1DM FU  Total time on call: This visit lasted >25 minutes. More then 50 %of the visit was devoted to counseling.  Follow up: 3 months.     Pediatric Endocrinology Diabetes Consultation Follow-up Visit  Srikar [facility] [birth year] [id number]  Chief Complaint: Follow-up type 1 diabetes   Eulis Foster, DO   HPI: Panayiotis  is a 23 y.o. male presenting for follow-up of type 1 diabetes.   1. Abbott was diagnosed with T1DM in 2000 at age 72 years. His first visit to our clinic was on 04/19/05 when he was referred to [place] by his PCP, Dr. Loyola Mast, for E&M of his T1DM.  In 2009 we converted him to a Medtronic Paradigm 722 insulin pump. Although his HbA1c values were generally better on the pump, there were times when he was so noncompliant that his HbA1c value rose to > 14%.  2. Since last visit to [facility] on 01/2019, he has been well.  No ER visits or hospitalizations.   He started on Tandem Tslim insulin pump with Dexcom CGM, reports it is going very well. He is amazed at how much better his blood usgars have been. He reports that his main issue is that he does not bolus when he eats because his pump seems to correct it anyways. He is rotating site about every 3-4 days and using different areas to prevent scar tissue.   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 He is having some difficult adjusting to the different schedule which he thinks is contributing to missed boluses. Overall he feels very good though.   Taking 10 mg of Lisinopril per day. Also taking  25 mcg of Levothyroxine 4 days per week and 37.5 mcg 3 days per week.   Insulin regimen: Basal Rates 12AM 1.05  5am 1.40  8am 1.30  2pm 1.25  8pm 1.05    Insulin to Carbohydrate Ratio 12AM  12  4pm 10  9pm 12          Insulin Sensitivity Factor 12AM 60  6am 40  9pm 60         Target Blood Glucose 12AM 140  6am 110  9pm 140          Hypoglycemia: Able to feel low blood sugars.  No glucagon needed recently. Feels shaky and tired when low. Keeps glucose with him.  Insulin Pump and CGM download   - Avg bg 177  - Target range; In target 56% above target 44% and below target 1%   - Using 48 units per day   - 65% basal and 35% bolus.  Med-alert ID: Not currently wearing.  Injection sites: butt and occasionally abdomen. Working on legs.  Annual labs due: Ordered  Ophthalmology due: 2020    3. ROS: Greater than 10 systems reviewed with pertinent positives listed in HPI, otherwise neg. Constitutional: Sleeping well. Weight stable.  Eyes: No changes in vision. Wears glasses. Denies blurry vision.  Ears/Nose/Mouth/Throat: No difficulty swallowing. Denies throat pain and neck pain.  Cardiovascular: No palpitations. Denies  chest pain and tachycardia Respiratory: No increased work of breathing. No SOB  Gastrointestinal: No constipation or diarrhea. No abdominal pain Genitourinary: No nocturia, no polyuria Musculoskeletal: No joint pain Neurologic: Normal sensation, no tremor Endocrine: No polydipsia.  No hyperpigmentation Psychiatric: Normal affect. Denies anxiety and depression. Currently seeing therapist at school [employer]  Past Medical History:   Past Medical History:  Diagnosis Date  . Diabetes mellitus type I (HCC)   . Hypertension   . Hypothyroidism     Medications:  Outpatient Encounter Medications as of 06/27/2019  Medication Sig  . Continuous Blood Gluc Sensor (DEXCOM G6 SENSOR) MISC 1 each by Does not apply route as directed. 1 sensor every 10 days (Patient  not taking: Reported on 08/30/2018)  . Continuous Blood Gluc Transmit (DEXCOM G6 TRANSMITTER) MISC 1 each by Does not apply route every 3 (three) months. (Patient not taking: Reported on 08/30/2018)  . glucagon 1 MG injection Follow package directions for low blood sugar.  Marland Kitchen. glucose blood test strip Use as instructed. Check sugar 6 times per day. For use with linking meter for Medtroninc pump  . HYDROcodone-acetaminophen (NORCO/VICODIN) 5-325 MG tablet Take 1-2 tablets by mouth every 4 (four) hours as needed for moderate pain. (Patient not taking: Reported on 02/03/2019)  . insulin aspart (NOVOLOG FLEXPEN) 100 UNIT/ML FlexPen Use up to 50 units daily  . insulin aspart (NOVOLOG FLEXPEN) 100 UNIT/ML FlexPen Inject up to 50 units per day  . insulin aspart (NOVOLOG) 100 UNIT/ML injection USE 300 UNITS IN INSULIN PUMP EVERY 48 HOURS  . Insulin Glargine (BASAGLAR KWIKPEN) 100 UNIT/ML SOPN Use up to 50 units daily.  . Insulin Glargine (LANTUS SOLOSTAR) 100 UNIT/ML Solostar Pen Inject up to 50 units per day  . Insulin Pen Needle 31G X 5 MM MISC BD Pen Needles- brand specific Inject insulin via insulin pen 7 x daily  . levothyroxine (SYNTHROID, LEVOTHROID) 25 MCG tablet Take 1 tablet 5 days a week and 1.5 tablets 2 days a week (Patient taking differently: Take 25-37.5 mcg by mouth See admin instructions. Take 1 tablet (25mcg) all days except on Wed and Fri take 1.5 tablets (37.5))  . lisinopril (PRINIVIL,ZESTRIL) 10 MG tablet Take 1 tablet (10 mg total) by mouth daily.   No facility-administered encounter medications on file as of 06/27/2019.     Allergies: No Known Allergies  Surgical History: Past Surgical History:  Procedure Laterality Date  . INCISION AND DRAINAGE ABSCESS Right 09/14/2018   Procedure: INCISION AND DRAINAGE ABSCESS;  Surgeon: Suzanna ObeyByers, John, MD;  [facility];  Service: ENT;  Laterality: Right;    Family History:  Family History  Problem Relation Age of Onset  . Diabetes Mother    . Hypertension Mother   . Thyroid disease Mother   . Obesity Mother   . Thyroid disease Father   . Diabetes Brother       Social History: Lives with roommates in college. Getting an apartment.  Working at [employer]   Physical Exam:  There were no vitals filed for this visit. There were no vitals taken for this visit. Body mass index: body mass index is unknown because there is no height or weight on file. Growth percentile SmartLinks can only be used for patients less than 22 years old.  Ht Readings from Last 3 Encounters:  09/14/18 5\' 7"  (1.702 m)  08/30/18 5' 6.61" (1.692 m)  04/03/18 5' 6.54" (1.69 m)   Wt Readings from Last 3 Encounters:  09/14/18 165  lb (74.8 kg)  08/30/18 161 lb 9.6 oz (73.3 kg)  04/03/18 169 lb 9.6 oz (76.9 kg)   PHYSICAL EXAM:  General: Well developed, well nourished male in no acute distress.  Alert and oriented.  Head: Normocephalic, atraumatic.   Eyes:  Pupils equal and round. EOMI.  Sclera white.  No eye drainage.  + glasses  Ears/Nose/Mouth/Throat: Nares patent, no nasal drainage.  Normal dentition, mucous membranes moist.  Neck: supple,  Cardiovascular: No cyanosis.  Respiratory: No increased work of breathing.   Skin: warm, dry.  No rash or lesions. Neurologic: alert and oriented, normal speech, no tremor  Labs:     Assessment/Plan: Kaelyn is a 24 y.o. male with uncontrolled type 1 diabetes on Tandem Tslim insulin pump and Dexcom CGM. He is doing well on Tandem Tslim insulin pump with Control IQ. His time in range has increased and he is having less variability. He is relying on insulin pump to bolus instead of entering carbs before eating which is causing prolonged hyperglycemia at times. He is clinically euthyroid on 25 mcg of levothyroxine 4 days per week and 37.5 mcg 3 days per week.  1. DM w/o complication type I, uncontrolled (HCC)/Hyperglycemia/hyperglycemia/Elevated a1c - Reviewed insulin pump and CGM  download. Discussed trends and patterns.  - Rotate pump sites to prevent scar tissue.  - bolus 15 minutes prior to eating to limit blood sugar spikes.  - Reviewed carb counting and importance of accurate carb counting.  - Discussed signs and symptoms of hypoglycemia. Always have glucose available.  - POCT glucose and hemoglobin A1c  - Reviewed growth chart.    2. Essential Hypertension  - 10 mg of Lisinopril daily. Discussed importance.   3. Hypothyroidism, acquired, autoimmune - 25 mcg of Synthroid per day 4 days per week and 37.5 mcg 3 days per week.   4. Insulin Pump Titration No changes today. Pump is in place.    5. Maladaptive Behaviors  - Discussed barriers to care and answered questions. - Praise given for improvements.    Follow-up:   3 months. Use mychart as needed    When a patient is on insulin, intensive monitoring of blood glucose levels is necessary to avoid hyperglycemia and hypoglycemia. Severe hyperglycemia/hypoglycemia can lead to hospital admissions and be life threatening.    Hermenia Bers,  FNP-C  Pediatric Specialist  [address] [place]  [place], [address]  Tele: [phone]

## 2019-09-29 ENCOUNTER — Ambulatory Visit (INDEPENDENT_AMBULATORY_CARE_PROVIDER_SITE_OTHER): Payer: BC Managed Care – PPO | Admitting: Family

## 2019-10-14 ENCOUNTER — Other Ambulatory Visit (INDEPENDENT_AMBULATORY_CARE_PROVIDER_SITE_OTHER): Payer: Self-pay

## 2019-10-14 ENCOUNTER — Encounter (INDEPENDENT_AMBULATORY_CARE_PROVIDER_SITE_OTHER): Payer: Self-pay | Admitting: Family

## 2019-10-14 ENCOUNTER — Other Ambulatory Visit: Payer: Self-pay

## 2019-10-14 ENCOUNTER — Ambulatory Visit (INDEPENDENT_AMBULATORY_CARE_PROVIDER_SITE_OTHER): Payer: Self-pay | Admitting: Family

## 2019-10-14 DIAGNOSIS — E1065 Type 1 diabetes mellitus with hyperglycemia: Secondary | ICD-10-CM

## 2019-10-14 DIAGNOSIS — E063 Autoimmune thyroiditis: Secondary | ICD-10-CM

## 2019-10-14 DIAGNOSIS — R7309 Other abnormal glucose: Secondary | ICD-10-CM

## 2019-10-14 DIAGNOSIS — I10 Essential (primary) hypertension: Secondary | ICD-10-CM

## 2019-10-14 DIAGNOSIS — Z9641 Presence of insulin pump (external) (internal): Secondary | ICD-10-CM

## 2019-10-14 DIAGNOSIS — F54 Psychological and behavioral factors associated with disorders or diseases classified elsewhere: Secondary | ICD-10-CM

## 2019-10-14 MED ORDER — GLUCAGON (RDNA) 1 MG IJ KIT: PACK | INTRAMUSCULAR | 4 refills | Status: AC

## 2019-10-14 NOTE — Progress Notes (Signed)
This is a Pediatric Specialist E-Visit follow up consult provided via  WebEx Chauncy Mangiaracina [facility] consented to an E-Visit consult today.  Location of patient: [name] is at [facility] office  Location of provider: Olean Ree is at work office  Patient was referred by Eulis Foster, DO   The following participants were involved in this E-Visit: Oda Kilts FNP  Chief Complain/ Reason for E-Visit today: T1Dm FU  Total time on call: >30  spent today reviewing the medical chart, counseling the patient/family, and documenting today's visit.   Follow up: 3 months in clinic      Pediatric Endocrinology Diabetes Consultation Follow-up Visit  Chad Holloway [facility] [birth year] [id number]  Chief Complaint: Follow-up type 1 diabetes   Eulis Foster, DO   HPI: Chad Holloway  is a 24 y.o. male presenting for follow-up of type 1 diabetes.   1. Kaniel was diagnosed with T1DM in 2000 at age 26 years. His first visit to our clinic was on 04/19/05 when he was referred to [place] by his PCP, Dr. Loyola Mast, for E&M of his T1DM.  In 2009 we converted him to a Medtronic Paradigm 722 insulin pump. Although his HbA1c values were generally better on the pump, there were times when he was so noncompliant that his HbA1c value rose to > 14%.  2. Since last visit to [facility] on 06/2019, he has been well.  No ER visits or hospitalizations.   Using Tslim insulin pump and Dexcom CGm. Both are working well for him. He is currently working as a Associate Professor 5 days per week. He feels like the closed loop pump system has been very helpful for him. He has noticed his blood sugars typically run high when he gets home from school because he will snack and forget to bolus. Otherwise, no concerns.   Taking 10 mg of Lisinopril per day. Also taking 25 mcg of Levothyroxine 4 days per week and 37.5 mcg 3 days per week.   Insulin regimen: Basal Rates 12AM 1.05  5am 1.40  8am 1.30  2pm 1.25  8pm 1.05    Insulin to Carbohydrate Ratio 12AM  12  4pm 10    9pm 12          Insulin Sensitivity Factor 12AM 60  6am 40  9pm 60         Target Blood Glucose 12AM 140  6am 110  9pm 140          Hypoglycemia: Able to feel low blood sugars.  No glucagon needed recently. Feels shaky and tired when low. Keeps glucose with him.  Insulin Pump and CGM download   Med-alert ID: Not currently wearing.  Injection sites: butt and occasionally abdomen. Working on legs.  Annual labs due: Ordered  Ophthalmology due: 2020    3. ROS: Greater than 10 systems reviewed with pertinent positives listed in HPI, otherwise neg. Constitutional: Sleeping well. Reports weight stable.  Eyes: No changes in vision. Wears glasses. Denies blurry vision.  Ears/Nose/Mouth/Throat: No difficulty swallowing. Denies throat pain and neck pain.  Cardiovascular: No palpitations. Denies chest pain and tachycardia Respiratory: No increased work of breathing. No SOB  Gastrointestinal: No constipation or diarrhea. No abdominal pain Genitourinary: No nocturia, no polyuria Musculoskeletal: No joint pain Neurologic: Normal sensation, no tremor Endocrine: No polydipsia.  No hyperpigmentation Psychiatric: Normal affect. Denies anxiety and depression.   Past Medical History:   Past Medical History:  Diagnosis Date  . Diabetes mellitus type I (HCC)   . Hypertension   .  Hypothyroidism     Medications:  Outpatient Encounter Medications as of 10/14/2019  Medication Sig Note  . Continuous Blood Gluc Sensor (DEXCOM G6 SENSOR) MISC 1 each by Does not apply route as directed. 1 sensor every 10 days 10/14/2019: Gets from [place]  . Continuous Blood Gluc Transmit (DEXCOM G6 TRANSMITTER) MISC 1 each by Does not apply route every 3 (three) months.   Marland Kitchen glucose blood test strip Use as instructed. Check sugar 6 times per day. For use with linking meter for Medtroninc pump   . HYDROcodone-acetaminophen (NORCO/VICODIN) 5-325 MG tablet Take 1-2 tablets by mouth every 4 (four) hours as  needed for moderate pain.   Marland Kitchen insulin aspart (NOVOLOG FLEXPEN) 100 UNIT/ML FlexPen Use up to 50 units daily   . insulin aspart (NOVOLOG FLEXPEN) 100 UNIT/ML FlexPen Inject up to 50 units per day   . insulin aspart (NOVOLOG) 100 UNIT/ML injection USE 300 UNITS IN INSULIN PUMP EVERY 48 HOURS   . Insulin Glargine (BASAGLAR KWIKPEN) 100 UNIT/ML SOPN Use up to 50 units daily.   . Insulin Glargine (LANTUS SOLOSTAR) 100 UNIT/ML Solostar Pen Inject up to 50 units per day   . Insulin Pen Needle 31G X 5 MM MISC BD Pen Needles- brand specific Inject insulin via insulin pen 7 x daily   . levothyroxine (SYNTHROID, LEVOTHROID) 25 MCG tablet Take 1 tablet 5 days a week and 1.5 tablets 2 days a week   . lisinopril (PRINIVIL,ZESTRIL) 10 MG tablet Take 1 tablet (10 mg total) by mouth daily.   . [DISCONTINUED] glucagon 1 MG injection Follow package directions for low blood sugar.    No facility-administered encounter medications on file as of 10/14/2019.    Allergies: No Known Allergies  Surgical History: Past Surgical History:  Procedure Laterality Date  . INCISION AND DRAINAGE ABSCESS Right 09/14/2018   Procedure: INCISION AND DRAINAGE ABSCESS;  Surgeon: Melissa Montane, MD;  Location: [facility] OR;  Service: ENT;  Laterality: Right;    Family History:  Family History  Problem Relation Age of Onset  . Diabetes Mother   . Hypertension Mother   . Thyroid disease Mother   . Obesity Mother   . Thyroid disease Father   . Diabetes Brother       Social History: Live with fiance  Working at [employer] high school   Physical Exam:  There were no vitals filed for this visit. There were no vitals taken for this visit. Body mass index: body mass index is unknown because there is no height or weight on file. Growth percentile SmartLinks can only be used for patients less than 20 years old.  Ht Readings from Last 3 Encounters:  09/14/18 5\' 7"  (1.702 m)  08/30/18 5' 6.61" (1.692 m)  04/03/18 5' 6.54"  (1.69 m)   Wt Readings from Last 3 Encounters:  09/14/18 165 lb (74.8 kg)  08/30/18 161 lb 9.6 oz (73.3 kg)  04/03/18 169 lb 9.6 oz (76.9 kg)   PHYSICAL EXAM:  General: Well developed, well nourished male in no acute distress.  Alert and oriented.  Head: Normocephalic, atraumatic.   Eyes:  Pupils equal and round. EOMI.  Sclera white.  No eye drainage.   Ears/Nose/Mouth/Throat: Nares patent, no nasal drainage.  Normal dentition, mucous membranes moist.  Neck: supple,  Cardiovascular:  No cyanosis.  Respiratory: No increased work of breathing.  Skin: warm, dry.  No rash or lesions. Neurologic: alert and oriented, normal speech, no tremor  Labs:     Assessment/Plan: Chad Holloway  is a 24 y.o. male with uncontrolled type 1 diabetes on Tandem Tslim insulin pump and Dexcom CGM. He continues to work towards tighter blood sugar control. Pattern of hyperglycemia between 5pm-2am is due to snacking/inaccurate bolusing when Vicente is working to improve. He is clinically euthyroid on levothyroxine therapy.   1. DM w/o complication type I, uncontrolled (HCC)/Hyperglycemia/hyperglycemia/Elevated a1c  - Reviewed insulin pump and CGM download. Discussed trends and patterns.  - Rotate pump sites to prevent scar tissue.  - bolus 15 minutes prior to eating to limit blood sugar spikes.  - Reviewed carb counting and importance of accurate carb counting.  - Discussed signs and symptoms of hypoglycemia. Always have glucose available.  - POCT glucose and hemoglobin A1c  - Reviewed growth chart.  - Discussed setting reminds to bolus at night.   2. Essential Hypertension  10 mg of lisinopril per day  Reviewed importance for renal protection.   3. Hypothyroidism, acquired, autoimmune - 25 mcg of Synthroid per day 4 days per week and 37.5 mcg 3 days per week.   4. Insulin Pump Titration No changes today. Pump is in place.    5. Maladaptive Behaviors  - Discussed barriers to care and answered questions. -  Praise given for improvements.    Follow-up:   3 months. Use mychart as needed    When a patient is on insulin, intensive monitoring of blood glucose levels is necessary to avoid hyperglycemia and hypoglycemia. Severe hyperglycemia/hypoglycemia can lead to hospital admissions and be life threatening.    Gretchen Short,  FNP-C  Pediatric Specialist  [address]  [place], [address]  Tele: [phone]

## 2019-10-14 NOTE — Patient Instructions (Signed)
-  Always have fast sugar with you in case of low blood sugar (glucose tabs, regular juice or soda, candy) -Always wear your ID that states you have diabetes -Always bring your meter to your visit -Call/Email if you want to review blood sugars   

## 2019-12-02 ENCOUNTER — Telehealth (INDEPENDENT_AMBULATORY_CARE_PROVIDER_SITE_OTHER): Payer: Self-pay | Admitting: Family

## 2019-12-02 ENCOUNTER — Other Ambulatory Visit (INDEPENDENT_AMBULATORY_CARE_PROVIDER_SITE_OTHER): Payer: Self-pay | Admitting: Pediatric Endocrinology

## 2019-12-02 MED ORDER — LEVOTHYROXINE SODIUM 25 MCG PO TABS: ORAL_TABLET | ORAL | 1 refills | Status: DC

## 2019-12-02 MED ORDER — LISINOPRIL 10 MG PO TABS: 10.0000 mg | ORAL_TABLET | Freq: Every day | ORAL | 0 refills | Status: DC

## 2019-12-02 NOTE — Telephone Encounter (Signed)
MyChart sent.

## 2019-12-02 NOTE — Telephone Encounter (Signed)
Who's calling (name and relationship to patient) : Corlis Hove self  Best contact number: [phone]  Provider they see: Gretchen Short  Reason for call: Requesting a refill for synthroid and lisinopril  Call ID:      PRESCRIPTION REFILL ONLY  Name of prescription: Synthroid and lisinopril  Pharmacy:  [facility] pharmacy [address]

## 2020-03-17 ENCOUNTER — Other Ambulatory Visit (INDEPENDENT_AMBULATORY_CARE_PROVIDER_SITE_OTHER): Payer: Self-pay | Admitting: Pediatric Endocrinology

## 2020-03-17 ENCOUNTER — Telehealth (INDEPENDENT_AMBULATORY_CARE_PROVIDER_SITE_OTHER): Payer: Self-pay | Admitting: Family

## 2020-03-17 ENCOUNTER — Other Ambulatory Visit (INDEPENDENT_AMBULATORY_CARE_PROVIDER_SITE_OTHER): Payer: Self-pay | Admitting: Family

## 2020-03-17 ENCOUNTER — Other Ambulatory Visit (INDEPENDENT_AMBULATORY_CARE_PROVIDER_SITE_OTHER): Payer: Self-pay

## 2020-03-17 MED ORDER — LEVOTHYROXINE SODIUM 25 MCG PO TABS: ORAL_TABLET | ORAL | 1 refills | Status: DC

## 2020-03-17 NOTE — Telephone Encounter (Signed)
Is this appropriate?  

## 2020-03-17 NOTE — Telephone Encounter (Signed)
°  Who's calling (name and relationship to patient) : Chad Holloway ( self)  Best contact number: [phone]  Provider they see: Gretchen Short  Reason for call: Medication refill     PRESCRIPTION REFILL ONLY  Name of prescription: Levothroxine   Pharmacy: [facility] [address] [place]

## 2020-03-18 MED ORDER — LEVOTHYROXINE SODIUM 25 MCG PO TABS: ORAL_TABLET | ORAL | 1 refills | Status: DC

## 2020-05-06 ENCOUNTER — Ambulatory Visit (INDEPENDENT_AMBULATORY_CARE_PROVIDER_SITE_OTHER): Payer: BC Managed Care – PPO | Admitting: Family

## 2020-06-13 ENCOUNTER — Other Ambulatory Visit: Payer: Self-pay | Admitting: "Endocrinology

## 2020-06-13 ENCOUNTER — Other Ambulatory Visit (INDEPENDENT_AMBULATORY_CARE_PROVIDER_SITE_OTHER): Payer: Self-pay | Admitting: Pediatric Endocrinology

## 2020-06-14 ENCOUNTER — Other Ambulatory Visit (INDEPENDENT_AMBULATORY_CARE_PROVIDER_SITE_OTHER): Payer: Self-pay | Admitting: Family

## 2020-06-14 ENCOUNTER — Encounter (INDEPENDENT_AMBULATORY_CARE_PROVIDER_SITE_OTHER): Payer: Self-pay

## 2020-06-30 ENCOUNTER — Encounter (INDEPENDENT_AMBULATORY_CARE_PROVIDER_SITE_OTHER): Payer: Self-pay | Admitting: Family

## 2020-06-30 ENCOUNTER — Ambulatory Visit (INDEPENDENT_AMBULATORY_CARE_PROVIDER_SITE_OTHER): Payer: BC Managed Care – PPO | Admitting: Family

## 2020-06-30 ENCOUNTER — Other Ambulatory Visit: Payer: Self-pay

## 2020-06-30 VITALS — BP 152/82 | HR 88 | Wt 180.6 lb

## 2020-06-30 DIAGNOSIS — I10 Essential (primary) hypertension: Secondary | ICD-10-CM

## 2020-06-30 DIAGNOSIS — R739 Hyperglycemia, unspecified: Secondary | ICD-10-CM

## 2020-06-30 DIAGNOSIS — E063 Autoimmune thyroiditis: Secondary | ICD-10-CM | POA: Diagnosis not present

## 2020-06-30 DIAGNOSIS — E1065 Type 1 diabetes mellitus with hyperglycemia: Secondary | ICD-10-CM

## 2020-06-30 DIAGNOSIS — R7309 Other abnormal glucose: Secondary | ICD-10-CM

## 2020-06-30 LAB — POCT GLUCOSE (DEVICE FOR HOME USE): POC Glucose: 66 mg/dl — AB (ref 70–99)

## 2020-06-30 LAB — POCT GLYCOSYLATED HEMOGLOBIN (HGB A1C): Hemoglobin A1C: 7.4 % — AB (ref 4.0–5.6)

## 2020-06-30 NOTE — Progress Notes (Signed)
Pediatric Endocrinology Diabetes Consultation Follow-up Visit  Chad Holloway [facility] January 08, 1996 [id number]  Chief Complaint: Follow-up type 1 diabetes   Chad Foster, DO   HPI: Chad Holloway  is a 24 y.o. male presenting for follow-up of type 1 diabetes.   1. Chad Holloway was diagnosed with T1DM in 2000 at age 49 years. His first visit to our clinic was on 04/19/05 when he was referred to [place] by his PCP, Dr. Loyola Mast, for E&M of his T1DM.  In 2009 we converted him to a Medtronic Paradigm 722 insulin pump. Although his HbA1c values were generally better on the pump, there were times when he was so noncompliant that his HbA1c value rose to > 14%.  2. Since last visit to [facility] on 09/2019, he has been well.  No ER visits or hospitalizations.   He is busy working as a Runner, broadcasting/film/video. Getting married in November and recently bought a house.   He is using Tslim insulin pump and Dexcom CGM which is working well. He states that he has started working out frequently which has helped his blood sugars. He is trying to improve his consistency with bolusing before eating.   He is suppose to take 10 mg of lisinopril daily but report he misses frequently. He has been off for 2 weeks and just started taking it again.   Also taking 25 mcg of Levothyroxine 4 days per week and 37.5 mcg 3 days per week.   Insulin regimen: Basal Rates 12AM 1.05  5am 1.40  8am 1.30  2pm 1.25  8pm 1.05    Insulin to Carbohydrate Ratio 12AM  12  4pm 10  9pm 12          Insulin Sensitivity Factor 12AM 60  6am 40  9pm 60         Target Blood Glucose 12AM 140  6am 110  9pm 140          Hypoglycemia: Able to feel low blood sugars.  No glucagon needed recently. Feels shaky and tired when low. Keeps glucose with him.  Insulin Pump and CGM download   Med-alert ID: Not currently wearing.  Injection sites: butt and occasionally abdomen. Working on legs.  Annual labs due: Ordered  Ophthalmology due: 03/2021    3. ROS: Greater than 10  systems reviewed with pertinent positives listed in HPI, otherwise neg. Constitutional: Sleeping well. Reports weight stable.  Eyes: No changes in vision. Wears glasses. Denies blurry vision.  Ears/Nose/Mouth/Throat: No difficulty swallowing. Denies throat pain and neck pain.  Cardiovascular: No palpitations. Denies chest pain and tachycardia Respiratory: No increased work of breathing. No SOB  Gastrointestinal: No constipation or diarrhea. No abdominal pain Genitourinary: No nocturia, no polyuria Musculoskeletal: No joint pain Neurologic: Normal sensation, no tremor Endocrine: No polydipsia.  No hyperpigmentation Psychiatric: Normal affect. Denies anxiety and depression.   Past Medical History:   Past Medical History:  Diagnosis Date  . Diabetes mellitus type I (HCC)   . Hypertension   . Hypothyroidism     Medications:  Outpatient Encounter Medications as of 06/30/2020  Medication Sig Note  . Continuous Blood Gluc Sensor (DEXCOM G6 SENSOR) MISC 1 each by Does not apply route as directed. 1 sensor every 10 days 10/14/2019: Gets from [place]  . Continuous Blood Gluc Transmit (DEXCOM G6 TRANSMITTER) MISC 1 each by Does not apply route every 3 (three) months.   . insulin aspart (NOVOLOG FLEXPEN) 100 UNIT/ML FlexPen Inject up to 50 units per day   . Insulin Glargine (  LANTUS SOLOSTAR) 100 UNIT/ML Solostar Pen Inject up to 50 units per day   . levothyroxine (SYNTHROID) 25 MCG tablet 25 mcg of Synthroid per day 4 days per week and 37.5 mcg 3 days per week.   Marland Kitchen lisinopril (ZESTRIL) 10 MG tablet Take 1 tablet by mouth once daily   . NOVOLOG 100 UNIT/ML injection INJECT 300 UNITS EVERY 48 HOURS   . glucagon 1 MG injection Follow package directions for low blood sugar. (Patient not taking: Reported on 06/30/2020)   . glucose blood test strip Use as instructed. Check sugar 6 times per day. For use with linking meter for Medtroninc pump   . HYDROcodone-acetaminophen (NORCO/VICODIN) 5-325 MG  tablet Take 1-2 tablets by mouth every 4 (four) hours as needed for moderate pain. (Patient not taking: Reported on 06/30/2020)   . insulin aspart (NOVOLOG FLEXPEN) 100 UNIT/ML FlexPen Use up to 50 units daily (Patient not taking: Reported on 06/30/2020)   . Insulin Glargine (BASAGLAR KWIKPEN) 100 UNIT/ML SOPN Use up to 50 units daily. (Patient not taking: Reported on 06/30/2020)   . Insulin Pen Needle 31G X 5 MM MISC BD Pen Needles- brand specific Inject insulin via insulin pen 7 x daily (Patient not taking: Reported on 06/30/2020)    No facility-administered encounter medications on file as of 06/30/2020.    Allergies: No Known Allergies  Surgical History: Past Surgical History:  Procedure Laterality Date  . INCISION AND DRAINAGE ABSCESS Right 09/14/2018   Procedure: INCISION AND DRAINAGE ABSCESS;  Surgeon: Suzanna Obey, MD;  [facility];  Service: ENT;  Laterality: Right;    Family History:  Family History  Problem Relation Age of Onset  . Diabetes Mother   . Hypertension Mother   . Thyroid disease Mother   . Obesity Mother   . Thyroid disease Father   . Diabetes Brother       Social History: Live with fiance  Working at [employer] high school   Physical Exam:  Vitals:   06/30/20 1520  BP: (!) 152/82  Pulse: 88  Weight: 180 lb 9.6 oz (81.9 kg)   BP (!) 152/82   Pulse 88   Wt 180 lb 9.6 oz (81.9 kg)   BMI 28.29 kg/m  Body mass index: body mass index is 28.29 kg/m. Growth percentile SmartLinks can only be used for patients less than 19 years old.  Ht Readings from Last 3 Encounters:  09/14/18 5\' 7"  (1.702 m)  08/30/18 5' 6.61" (1.692 m)  04/03/18 5' 6.54" (1.69 m)   Wt Readings from Last 3 Encounters:  06/30/20 180 lb 9.6 oz (81.9 kg)  09/14/18 165 lb (74.8 kg)  08/30/18 161 lb 9.6 oz (73.3 kg)   PHYSICAL EXAM:  General: Well developed, well nourished male in no acute distress.   Head: Normocephalic, atraumatic.   Eyes:  Pupils equal and  round. EOMI.  Sclera white.  No eye drainage.   Ears/Nose/Mouth/Throat: Nares patent, no nasal drainage.  Normal dentition, mucous membranes moist.  Neck: supple, no cervical lymphadenopathy, no thyromegaly Cardiovascular: regular rate, normal S1/S2, no murmurs Respiratory: No increased work of breathing.  Lungs clear to auscultation bilaterally.  No wheezes. Abdomen: soft, nontender, nondistended. Normal bowel sounds.  No appreciable masses  Extremities: warm, well perfused, cap refill < 2 sec.   Musculoskeletal: Normal muscle mass.  Normal strength Skin: warm, dry.  No rash or lesions. Neurologic: alert and oriented, normal speech, no tremor  Labs:     Assessment/Plan: Chad Holloway is a 24  y.o. male with uncontrolled type 1 diabetes on Tandem Tslim insulin pump and Dexcom CGM. Not bolusing before eating and relying on pump for auto bolus/corrections which is leading to time in hyperglycemic range. His hemoglobin A1c is 7.4%  . Has elevated blood pressure in clinic today likely due to not consistently taking Lisinopril. Clinically euthyroid. Will transition to adult endocrinology.   1. DM w/o complication type I, uncontrolled (HCC)/Hyperglycemia/hyperglycemia/Elevated a1c - Reviewed insulin pump and CGM download. Discussed trends and patterns.  - Rotate pump sites to prevent scar tissue.  - bolus 15 minutes prior to eating to limit blood sugar spikes.  - Reviewed carb counting and importance of accurate carb counting.  - Discussed signs and symptoms of hypoglycemia. Always have glucose available.  - POCT glucose and hemoglobin A1c  - Reviewed growth chart.  - Discussed transition to adult care.  - TFT's, microalbumin and lipid panel ordered.   2. Essential Hypertension  10 mg of lisinopril per day  Reviewed importance for renal protection.   3. Hypothyroidism, acquired, autoimmune - 25 mcg of Synthroid per day 4 days per week and 37.5 mcg 3 days per week.   4. Insulin Pump Titration No  changes today. Pump is in place.    Follow-up:   3 months. Use mychart as needed    >45 spent today reviewing the medical chart, counseling the patient/family, and documenting today's visit.  When a patient is on insulin, intensive monitoring of blood glucose levels is necessary to avoid hyperglycemia and hypoglycemia. Severe hyperglycemia/hypoglycemia can lead to hospital admissions and be life threatening.    Gretchen Short,  FNP-C  Pediatric Specialist  [address]  [place], [address]  Tele: [phone]

## 2020-06-30 NOTE — Telephone Encounter (Signed)
m °

## 2020-06-30 NOTE — Patient Instructions (Addendum)
Refer to see Dr. Lafe Garin and [facility]  - I will continue to do refills on medication for 6 months.  - Please send mychart message if you are having any problems prior to being seen.   Hypoglycemia   Shaking or trembling.  Sweating and chills.  Dizziness or lightheadedness.  Faster heart rate.  Headaches.  Hunger.  Nausea.  Nervousness or irritability.  Pale skin.  Restless sleep.  Weakness.  Blurry vision.  Confusion or trouble concentrating.  Sleepiness.  Slurred speech.  Tingling or numbness in the face or mouth.  How do I treat an episode of hypoglycemia? The American Diabetes Association recommends the 15-15 rule for an episode of hypoglycemia:  Eat or drink 15 grams of carbs to raise your blood sugar.  After 15 minutes, check your blood sugar.  If it's still below 70 mg/dL, have another 15 grams of carbs.  Repeat until your blood sugar is at least 70 mg/dL.  Hyperglycemia   Frequent urination  Increased thirst  Blurred vision  Fatigue  Headache Diabetic Ketoacidosis (DKA)  If hyperglycemia goes untreated, it can cause toxic acids (ketones) to build up in your blood and urine (ketoacidosis). Signs and symptoms include:  Fruity-smelling breath  Nausea and vomiting  Shortness of breath  Dry mouth  Weakness  Confusion  Coma  Abdominal pain        Sick day/Ketones Protocol   Check blood glucose every 2 hours   Check urine ketones every 2 hours (until ketones are clear)   Drink plenty of fluids (water, Pedialyte) hourly  Give rapid acting insulin correction dose every 3 hours until ketones are clear   Notify clinic of sickness/ketones   If you develop signs of DKA, go to ER immediately.   Hemoglobin A1c levels

## 2020-07-01 LAB — MICROALBUMIN / CREATININE URINE RATIO
Creatinine, Urine: 122 mg/dL (ref 20–320)
Microalb Creat Ratio: 7 mcg/mg creat (ref <30)
Microalb, Ur: 0.8 mg/dL

## 2020-07-01 LAB — TSH: TSH: 2.34 mIU/L (ref 0.40–4.50)

## 2020-07-01 LAB — LIPID PANEL
Cholesterol: 135 mg/dL (ref <200)
HDL: 44 mg/dL (ref >=40)
LDL Cholesterol (Calc): 77 mg/dL (calc)
Non-HDL Cholesterol (Calc): 91 mg/dL (calc) (ref <130)
Total CHOL/HDL Ratio: 3.1 (calc) (ref <5.0)
Triglycerides: 65 mg/dL (ref <150)

## 2020-07-01 LAB — T4, FREE: Free T4: 1.3 ng/dL (ref 0.8–1.8)

## 2020-07-11 ENCOUNTER — Other Ambulatory Visit: Payer: Self-pay | Admitting: Family

## 2020-09-29 ENCOUNTER — Other Ambulatory Visit (INDEPENDENT_AMBULATORY_CARE_PROVIDER_SITE_OTHER): Payer: Self-pay | Admitting: Family

## 2020-10-18 ENCOUNTER — Other Ambulatory Visit: Payer: Self-pay

## 2020-10-19 ENCOUNTER — Encounter: Payer: Self-pay | Admitting: Internal Medicine

## 2020-10-20 ENCOUNTER — Ambulatory Visit (INDEPENDENT_AMBULATORY_CARE_PROVIDER_SITE_OTHER): Payer: BC Managed Care – PPO | Admitting: Internal Medicine

## 2020-10-20 ENCOUNTER — Encounter: Payer: Self-pay | Admitting: Internal Medicine

## 2020-10-20 ENCOUNTER — Other Ambulatory Visit: Payer: Self-pay

## 2020-10-20 VITALS — BP 136/82 | HR 82 | Ht 67.0 in | Wt 190.2 lb

## 2020-10-20 DIAGNOSIS — E1065 Type 1 diabetes mellitus with hyperglycemia: Secondary | ICD-10-CM

## 2020-10-20 DIAGNOSIS — E063 Autoimmune thyroiditis: Secondary | ICD-10-CM

## 2020-10-20 LAB — POCT GLYCOSYLATED HEMOGLOBIN (HGB A1C): Hemoglobin A1C: 7.5 % — AB (ref 4.0–5.6)

## 2020-10-20 NOTE — Progress Notes (Signed)
Name: Chad Holloway [facility]  MRN/ DOB: [id number], 1996-03-25   Age/ Sex: 25 y.o., male    PCP: Chad Foster, DO   Reason for Endocrinology Evaluation: Type 1 Diabetes Mellitus     Date of Initial Endocrinology Visit: 10/20/2020     PATIENT IDENTIFIER: [name] is a 25 y.o. male with a past medical history of T1DM, HTN and Hypothyroidism. The patient presented for initial endocrinology clinic visit on 10/20/2020 for consultative assistance with his diabetes management.    HPI: [name] was    Diagnosed with T1DM in the year 2000 at age 89 Prior Medications tried/Intolerance: started insulin pump therapy in 2009. Started on the T-slim 2020 Currently checking blood sugars multiple times a day through CGM  Hypoglycemia episodes : rare           Hemoglobin A1c has ranged from 7.4% in 2021, peaking at 13.5% in 2017. Patient required assistance for hypoglycemia: no  Patient has required hospitalization within the last 1 year from hyper or hypoglycemia: no  In terms of diet, the patient eats 1 meal ( supper) ,snacks through the day   This patient with type 1 diabetes is treated with Tandem (insulin pump). During the visit the pump basal and bolus doses were reviewed including carb/insulin rations and supplemental doses. The clinical list was updated. The glucose meter download was reviewed in detail to determine if the current pump settings are providing the best glycemic control without excessive hypoglycemia.    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:    Pump   Tandem  Settings   Insulin type   NOVOLOG    Basal rate       0000-0500  1.05 u/h    0500-0800  1.40 u/h    0800-1400  1.30 u/h    1400- 2000 1.250   2000-0000 1.05          I:C ratio       0000-1600 1:12   1600-2100  1:10    2100-0000 1: 12          Sensitivity       0000 -0600 60   0600- 2100 40   2100- 0000 60      Goal       0000  110-140            Type & Model of Pump: Tandem Insulin  Type: Currently using Novolog   PUMP STATISTICS: Average BG: 176  Average Daily Carbs (g): 101  Average Total Daily Insulin: 51.09  Average Daily Basal: 34.43 (67 %) Average Daily Bolus: 10.75 (33 %)   CONTINUOUS GLUCOSE MONITORING RECORD INTERPRETATION    Dates of Recording: 1/19-10/19/2020  Sensor description:dexcom  Results statistics:   CGM use % of time 96  Average  176/  Time in range    58    %  % Time Above 180 41  % Time Below target 1      Glycemic patterns summary: hyperglycemia is noted postprandial   Hyperglycemic episodes  Post supper   Hypoglycemic episodes occurred rare  Overnight periods: starts high but trends down        THYROID HISTORY: He has been diagnosed with hypothyroidism secondary to Hashimoto's thyroiditis as a child and has been on LT-4 replacement for years.     HOME ENDOCRINE REGIMEN: Levothyroxine 25 mcg daily    Statin: No  ACE-I/ARB: Yes Prior Diabetic Education: yes     DIABETIC  COMPLICATIONS: Microvascular complications:    Denies: CKD, neuropathy, retinopathy   Last eye exam: Completed 2021  Macrovascular complications:    Denies: CAD, PVD, CVA   PAST HISTORY: Past Medical History:  Past Medical History:  Diagnosis Date  . Diabetes mellitus type I (HCC)   . Hypertension   . Hypothyroidism    Past Surgical History:  Past Surgical History:  Procedure Laterality Date  . INCISION AND DRAINAGE ABSCESS Right 09/14/2018   Procedure: INCISION AND DRAINAGE ABSCESS;  Surgeon: Suzanna Obey, MD;  [facility]: [facility];  Service: ENT;  Laterality: Right;      Social History:  reports that he has never smoked. He has never used smokeless tobacco. He reports current alcohol use. He reports that he does not use drugs. Family History:  Family History  Problem Relation Age of Onset  . Diabetes Mother   . Hypertension Mother   . Thyroid disease Mother   . Obesity Mother   . Thyroid disease Father   . Diabetes  Brother      HOME MEDICATIONS: Allergies as of 10/20/2020   No Known Allergies     Medication List       Accurate as of October 20, 2020  8:26 AM. If you have any questions, ask your nurse or doctor.        Dexcom G6 Sensor Misc 1 each by Does not apply route as directed. 1 sensor every 10 days   Dexcom G6 Transmitter Misc 1 each by Does not apply route every 3 (three) months.   glucagon 1 MG injection Follow package directions for low blood sugar.   glucose blood test strip Use as instructed. Check sugar 6 times per day. For use with linking meter for Medtroninc pump   HYDROcodone-acetaminophen 5-325 MG tablet Commonly known as: NORCO/VICODIN Take 1-2 tablets by mouth every 4 (four) hours as needed for moderate pain.   insulin aspart 100 UNIT/ML FlexPen Commonly known as: NovoLOG FlexPen Use up to 50 units daily   insulin aspart 100 UNIT/ML FlexPen Commonly known as: NovoLOG FlexPen Inject up to 50 units per day   NovoLOG 100 UNIT/ML injection Generic drug: insulin aspart INJECT 300 UNITS INTO PUMP EVERY 48 HOURS   insulin glargine 100 UNIT/ML Solostar Pen Commonly known as: Lantus SoloStar Inject up to 50 units per day   Basaglar KwikPen 100 UNIT/ML Use up to 50 units daily.   Insulin Pen Needle 31G X 5 MM Misc BD Pen Needles- brand specific Inject insulin via insulin pen 7 x daily   levothyroxine 25 MCG tablet Commonly known as: SYNTHROID 25 mcg of Synthroid per day 4 days per week and 37.5 mcg 3 days per week.   lisinopril 10 MG tablet Commonly known as: ZESTRIL Take 1 tablet by mouth once daily        ALLERGIES: No Known Allergies   REVIEW OF SYSTEMS: A comprehensive ROS was conducted with the patient and is negative except as per HPI and below:  Review of Systems  Gastrointestinal: Negative for diarrhea and vomiting.  Genitourinary: Negative for frequency.  Endo/Heme/Allergies: Negative for polydipsia.      OBJECTIVE:   VITAL SIGNS:  BP 136/82   Pulse 82   Ht 5\' 7"  (1.702 m)   Wt 190 lb 4 oz (86.3 kg)   SpO2 98%   BMI 29.80 kg/m    PHYSICAL EXAM:  General: Pt appears well and is in NAD  Neck: General: Supple without adenopathy or carotid bruits. Thyroid: Thyroid  size normal.  No goiter or nodules appreciated. No thyroid bruit.  Lungs: Clear with good BS bilat with no rales, rhonchi, or wheezes  Heart: RRR with normal S1 and S2 and no gallops; no murmurs; no rub  Abdomen: Normoactive bowel sounds, soft, nontender, without masses or organomegaly palpable  Extremities:  Lower extremities - No pretibial edema. No lesions.  Skin: Normal texture and temperature to palpation. No rash noted. No Acanthosis nigricans/skin tags. No lipohypertrophy.  Neuro: MS is good with appropriate affect, pt is alert and Ox3    DM foot exam: 10/20/2020  The skin of the feet is intact without sores or ulcerations. The pedal pulses are 2+ on right and 2+ on left. The sensation is intact to a screening 5.07, 10 gram monofilament bilaterally   DATA REVIEWED:  Lab Results  Component Value Date   HGBA1C 7.5 (A) 10/20/2020   HGBA1C 7.4 (A) 06/30/2020   HGBA1C 8.1 (A) 08/30/2018   Lab Results  Component Value Date   MICROALBUR 0.8 06/30/2020   LDLCALC 77 06/30/2020   CREATININE 1.03 09/14/2018   Lab Results  Component Value Date   MICRALBCREAT 7 06/30/2020    Lab Results  Component Value Date   CHOL 135 06/30/2020   HDL 44 06/30/2020   LDLCALC 77 06/30/2020   TRIG 65 06/30/2020   CHOLHDL 3.1 06/30/2020        ASSESSMENT / PLAN / RECOMMENDATIONS:   1) Type 1 Diabetes Mellitus, Sub-Optimally controlled, Without complications - Most recent A1c of 7.5 %. Goal A1c < 7.0 %.    Plan: GENERAL:  We reviewed his CGM/Meter download today, pt was advised to bolus with [facility] snack as well as a meal. We also emphasized the importance of bolusing with the start of the meal rather then half way .   Overall his diabetes self care has  improved tremendously over the years   I am going to adjust his I: C ratio for evening meals   MEDICATIONS: Pump   Tandem   Settings   Insulin type   NOVOLOG    Basal rate       0000-0500  1.05 u/h    0500-0800  1.40 u/h    0800-1400  1.30 u/h    1400- 2000 1.250   2000-0000 1.05          I:C ratio       0000-1600 1:12   1600-2100  1:8   2100-0000 1:10             EDUCATION / INSTRUCTIONS:  BG monitoring instructions: Patient is instructed to check his blood sugars 4 times a day, before meals and bedtime .  Call [facility] clinic if: BG persistently < 70 . I reviewed the Rule of 15 for the treatment of hypoglycemia in detail with the patient. Literature supplied.   2) Diabetic complications:   Eye: Does not have known diabetic retinopathy.   Neuro/ Feet: Does not have known diabetic peripheral neuropathy.  Renal: Patient does not have known baseline CKD. He is on an ACEI/ARB at present.    3) Lipids: Patient is not on a statin. Lipid panel at goal      4) Hashimoto's Thyroiditis :  - Pt educated extensively on the correct way to take levothyroxine (first thing in the morning with water, 30 minutes before eating or taking other medications). - Pt encouraged to double dose the following day if she were to miss a dose given long half-life of  levothyroxine. - Pt to switch from evening to morning time, will check TSH on next visit   Medication Continue Levothyroxine 25 mcg daily     F/U in 3 months   Signed electronically by: Lyndle Herrlich, MD  [facility] Endocrinology  [facility] [address] [place], [place] [address] Phone: [phone] FAX: [phone]   CC: Chad Foster, DO [address] [place] [address] Phone: [phone]  Fax: [phone]    Return to Endocrinology clinic as below: No future appointments.

## 2020-10-20 NOTE — Patient Instructions (Addendum)
Pump   Tandem   Settings   Insulin type   NOVOLOG    Basal rate       0000-0500  1.05 u/h    0500-0800  1.40 u/h    0800-1400  1.30 u/h    1400- 2000 1.250   2000-0000 1.05          I:C ratio       0000-1600 1:12   1600-2100  1:8   2100-0000 1:10

## 2020-10-26 ENCOUNTER — Encounter: Payer: Self-pay | Admitting: Internal Medicine

## 2020-10-29 ENCOUNTER — Other Ambulatory Visit (INDEPENDENT_AMBULATORY_CARE_PROVIDER_SITE_OTHER): Payer: Self-pay | Admitting: Family

## 2020-11-02 ENCOUNTER — Other Ambulatory Visit (INDEPENDENT_AMBULATORY_CARE_PROVIDER_SITE_OTHER): Payer: Self-pay

## 2020-11-02 DIAGNOSIS — I10 Essential (primary) hypertension: Secondary | ICD-10-CM

## 2020-11-02 MED ORDER — LISINOPRIL 10 MG PO TABS: 10.0000 mg | ORAL_TABLET | Freq: Every day | ORAL | 5 refills | Status: DC

## 2021-01-17 ENCOUNTER — Other Ambulatory Visit: Payer: Self-pay

## 2021-01-17 ENCOUNTER — Encounter: Payer: Self-pay | Admitting: Internal Medicine

## 2021-01-17 ENCOUNTER — Ambulatory Visit: Payer: BC Managed Care – PPO | Admitting: Internal Medicine

## 2021-01-17 VITALS — BP 134/84 | HR 98 | Ht 67.0 in | Wt 195.1 lb

## 2021-01-17 DIAGNOSIS — E063 Autoimmune thyroiditis: Secondary | ICD-10-CM

## 2021-01-17 DIAGNOSIS — E1065 Type 1 diabetes mellitus with hyperglycemia: Secondary | ICD-10-CM

## 2021-01-17 LAB — TSH: TSH: 3.02 u[IU]/mL (ref 0.35–4.50)

## 2021-01-17 LAB — POCT GLYCOSYLATED HEMOGLOBIN (HGB A1C): Hemoglobin A1C: 7.4 % — AB (ref 4.0–5.6)

## 2021-01-17 NOTE — Progress Notes (Signed)
Name: Chad Holloway [facility]  Age/ Sex: 25 y.o., male   MRN/ DOB: [id number], 1996/04/06     PCP: Chad Foster, DO   Reason for Endocrinology Evaluation: Type 1 Diabetes Mellitus  Initial Endocrine Consultative Visit: 10/20/2020    PATIENT IDENTIFIER: Chad Holloway is a 25 y.o. male with a past medical history of T1DM, HTN and Hypothyroidism. . The patient has followed with Endocrinology clinic since 10/20/2020 for consultative assistance with management of his diabetes.  DIABETIC HISTORY:  Chad Holloway was diagnosed with T1DM in the year 2000 at age 5.started insulin pump therapy in 2009. Started on the T-slim 2020  His hemoglobin A1c has ranged from 7.4% in 2021, peaking at 13.5% in 2017.  In terms of diet, the patient eats 1 meal ( supper) ,snacks through the day   He is a Engineer, site     THYROID HISTORY: He has been diagnosed with hypothyroidism secondary to Hashimoto's thyroiditis as a child and has been on LT-4 replacement for years.     SUBJECTIVE:   During the last visit (10/20/2020): A1c 7.5% , we adjusted pump setting   Today (01/17/2021): Chad Holloway is here for a follow up on diabetes management.   He checks his blood sugars multiple times daily,through CGM. The patient has  had hypoglycemic episodes since the last clinic visit, which typically occur sporadically. The patient is symptomatic with these episodes.  This patient with type 1 diabetes is treated with Tandem (insulin pump). During the visit the pump basal and bolus doses were reviewed including carb/insulin rations and supplemental doses. The clinical list was updated. The glucose meter download was reviewed in detail to determine if the current pump settings are providing the best glycemic control without excessive hypoglycemia.   He tried to take Levothyroxine in the morning but ended up forgetting more  Denies nausea or nausea   Wife is pregnant , EDD is  11/3rd /2022   Pump and meter download:    Pump    Tandem  Settings  I:C ratio CF Target  Insulin type   NOVOLOG       Basal rate          0000-0500  1.05 u/h  12 60    0500-0600  1.40 u/h  12 60    0600- 0800 1.400 12 40    0800-1400  1.30 u/h  12 40    1400- 1600 1.250 12 40    1600- 2000 1.250 8 40    2000- 2100 1.050 8 40    [phone] 1.050 10 60                    Type & Model of Pump: Tandem Insulin Type: Currently using Novolog   PUMP STATISTICS: Average BG: 188 Average Daily Carbs (g): 282 Average Total Daily Insulin: 66.82 Average Daily Basal: 38.30 (57%) Average Daily Bolus: 32 (21.19 %)      HOME ENDOCRINE REGIMEN:  Levothyroxine 25 mcg daily  Novolog   Statin: no ACE-I/ARB: yes Prior Diabetic Education: yes     DIABETIC COMPLICATIONS: Microvascular complications:    Denies: CKD, retinopathy, neuropathy   Last Eye Exam: Completed 2021  Macrovascular complications:    Denies: CAD, CVA, PVD   HISTORY:  Past Medical History:  Past Medical History:  Diagnosis Date  . Diabetes mellitus type I (HCC)   . Hypertension   . Hypothyroidism    Past Surgical History:  Past Surgical History:  Procedure Laterality Date  .  INCISION AND DRAINAGE ABSCESS Right 09/14/2018   Procedure: INCISION AND DRAINAGE ABSCESS;  Surgeon: Suzanna Obey, MD;  Location: [facility] OR;  Service: ENT;  Laterality: Right;    Social History:  reports that he has never smoked. He has never used smokeless tobacco. He reports current alcohol use. He reports that he does not use drugs. Family History:  Family History  Problem Relation Age of Onset  . Diabetes Mother   . Hypertension Mother   . Thyroid disease Mother   . Obesity Mother   . Thyroid disease Father   . Diabetes Brother      HOME MEDICATIONS: Allergies as of 01/17/2021   No Known Allergies     Medication List       Accurate as of Jan 17, 2021 10:59 AM. If you have any questions, ask your nurse or doctor.        Dexcom G6 Sensor  Misc 1 each by Does not apply route as directed. 1 sensor every 10 days   Dexcom G6 Transmitter Misc 1 each by Does not apply route every 3 (three) months.   glucagon 1 MG injection Follow package directions for low blood sugar.   glucose blood test strip Use as instructed. Check sugar 6 times per day. For use with linking meter for [employer]tron[employer] pump   HYDROcodone-acetaminophen 5-325 MG tablet Commonly known as: NORCO/VICODIN Take 1-2 tablets by mouth every 4 (four) hours as needed for moderate pain.   insulin aspart 100 UNIT/ML FlexPen Commonly known as: NovoLOG FlexPen Use up to 50 units daily   insulin aspart 100 UNIT/ML FlexPen Commonly known as: NovoLOG FlexPen Inject up to 50 units per day   NovoLOG 100 UNIT/ML injection Generic drug: insulin aspart INJECT 300 UNITS INTO PUMP EVERY 48 HOURS   insulin glargine 100 UNIT/ML Solostar Pen Commonly known as: Lantus SoloStar Inject up to 50 units per day   Basaglar KwikPen 100 UNIT/ML Use up to 50 units daily.   Insulin Pen Needle 31G X 5 MM Misc BD Pen Needles- brand specific Inject insulin via insulin pen 7 x daily   levothyroxine 25 MCG tablet Commonly known as: SYNTHROID 25 mcg of Synthroid per day 4 days per week and 37.5 mcg 3 days per week.   lisinopril 10 MG tablet Commonly known as: ZESTRIL Take 1 tablet (10 mg total) by mouth daily.        OBJECTIVE:   Vital Signs: BP 134/84   Pulse 98   Ht 5\' 7"  (1.702 m)   Wt 195 lb 2 oz (88.5 kg)   SpO2 98%   BMI 30.56 kg/m   Wt Readings from Last 3 Encounters:  01/17/21 195 lb 2 oz (88.5 kg)  10/20/20 190 lb 4 oz (86.3 kg)  06/30/20 180 lb 9.6 oz (81.9 kg)     Exam: General: Pt appears well and is in NAD  Neck: General: Supple without adenopathy. Thyroid: Thyroid size normal.  No goiter or nodules appreciated.   Lungs: Clear with good BS bilat with no rales, rhonchi, or wheezes  Heart: RRR with normal S1 and S2 and no gallops; no murmurs; no rub   Abdomen: Normoactive bowel sounds, soft, nontender, without masses or organomegaly palpable  Extremities: No pretibial edema.   Neuro: MS is good with appropriate affect, pt is alert and Ox3   DM foot exam: 10/20/2020  The skin of the feet is intact without sores or ulcerations. The pedal pulses are 2+ on right and 2+ on left.  The sensation is intact to a screening 5.07, 10 gram monofilament bilaterally     DATA REVIEWED:  Lab Results  Component Value Date   HGBA1C 7.4 (A) 01/17/2021   HGBA1C 7.5 (A) 10/20/2020   HGBA1C 7.4 (A) 06/30/2020   Lab Results  Component Value Date   MICROALBUR 0.8 06/30/2020   LDLCALC 77 06/30/2020   CREATININE 1.03 09/14/2018   Lab Results  Component Value Date   MICRALBCREAT 7 06/30/2020     Lab Results  Component Value Date   CHOL 135 06/30/2020   HDL 44 06/30/2020   LDLCALC 77 06/30/2020   TRIG 65 06/30/2020   CHOLHDL 3.1 06/30/2020       Results for Westerhoff, DORIS MCGILVERY (MRN [id number]) as of 01/18/2021 08:46  Ref. Range 01/17/2021 09:37  TSH Latest Ref Range: 0.35 - 4.50 uIU/mL 3.02    ASSESSMENT / PLAN / RECOMMENDATIONS:   1) Type 1 Diabetes Mellitus, Sub-Optimally controlled, With out complications - Most recent A1c of 7.4 %. Goal A1c < 7.0 %.     - A1c stable  - He has been doing better with bolusing with meals,  - We have reviewed his pump download, he has bee noted with postprandial hyperglycemia even with blous , BG's tight during the day at times between meals with occasional hypoglycemia  - Will adjust daytime basal rate as well as I:C ratio as below   Pump   Tandem  Settings  I:C ratio CF  Insulin type   NOVOLOG      Basal rate         0000-0500  1.05 u/h  12 60   0500-0600  1.40 u/h  12 60   0600- 0800 1.400 12 40   0800-1400  1.25u/h  10 40   1400- 1600 1.125 10 40   1600- 2000 1.250 7 40   2000- 2100 1.050 7 40   [phone] 1.050 9 60     MEDICATIONS:  Novolog   EDUCATION / INSTRUCTIONS:  BG  monitoring instructions: Patient is instructed to check his blood sugars 4 times a day, before meals and beditme.  Call [facility] if: BG persistently < 70  . I reviewed the Rule of 15 for the treatment of hypoglycemia in detail with the patient. Literature supplied.   2) Diabetic complications:   Eye: Does not have known diabetic retinopathy.   Neuro/ Feet: Does not have known diabetic peripheral neuropathy .   Renal: Patient does not have known baseline CKD. He   is  on an ACEI/ARB at present.     3) Hashimoto's Thyroiditis:  - Pt is clinically euthyroid  - He has tried to take Levothyroxine in the morning but was forgetting to take it and he is back in taking it at night     Continue levothyroxine 25 mcg daily      F/U in 6 months    Signed electronically by: Lyndle Herrlich, MD  [facility] Endocrinology  [facility] [address] [place] [place], [place] [address] Phone: [phone] FAX: [phone]   CC: Chad Foster, DO [address] [place] [address] Phone: [phone]  Fax: [phone]  Return to Endocrinology clinic as below: Future Appointments  Date Time Provider Department Center  08/03/2021  9:10 AM Joshwa Hemric, Konrad Dolores, MD [facility] None

## 2021-01-17 NOTE — Patient Instructions (Addendum)
Pump   Tandem  Settings  I:C ratio CF  Insulin type   NOVOLOG      Basal rate         0000-0500  1.05 u/h  12 60   0500-0600  1.40 u/h  12 60   0600- 0800 1.400 12 40   0800-1400  1.25u/h  10 40   1400- 1600 1.125 10 40   1600- 2000 1.250 7 40   2000- 2100 1.050 7 40   [phone] 1.050 9 60

## 2021-01-18 MED ORDER — LEVOTHYROXINE SODIUM 25 MCG PO TABS: 25.0000 ug | ORAL_TABLET | Freq: Every day | ORAL | 3 refills | Status: DC

## 2021-02-19 ENCOUNTER — Other Ambulatory Visit: Payer: Self-pay | Admitting: Family

## 2021-02-21 ENCOUNTER — Other Ambulatory Visit: Payer: Self-pay | Admitting: Internal Medicine

## 2021-02-21 ENCOUNTER — Encounter: Payer: Self-pay | Admitting: Internal Medicine

## 2021-02-21 ENCOUNTER — Other Ambulatory Visit: Payer: Self-pay | Admitting: Family

## 2021-02-21 MED ORDER — INSULIN ASPART 100 UNIT/ML IJ SOLN: INTRAMUSCULAR | 3 refills | Status: DC

## 2021-02-21 NOTE — Telephone Encounter (Signed)
Please advise.For Dr Lonzo Cloud. I was going to refill his Novolog but I am unsure how to sent it for the pump and patient needs it today

## 2021-05-09 ENCOUNTER — Telehealth: Payer: Self-pay | Admitting: Internal Medicine

## 2021-05-09 NOTE — Telephone Encounter (Signed)
Pt's mother came by and dropped off paperwork for patient from the [facility] that needs to be filled out. Put in providers tray at the front desk. 05/09/2021 at 3:30pm

## 2021-05-11 NOTE — Telephone Encounter (Signed)
Gave paperwork to dr Lonzo Cloud

## 2021-05-13 ENCOUNTER — Telehealth: Payer: Self-pay | Admitting: *Deleted

## 2021-05-13 NOTE — Telephone Encounter (Signed)
Notified pt -forms are complete and ready to be pick-up at the front office.

## 2021-05-13 NOTE — Telephone Encounter (Signed)
Pt's mother came by to pick up paperwork on 05/13/2021 at 1:55pm

## 2021-08-03 ENCOUNTER — Ambulatory Visit: Payer: BC Managed Care – PPO | Admitting: Internal Medicine

## 2021-10-03 ENCOUNTER — Telehealth: Payer: Self-pay

## 2021-10-03 ENCOUNTER — Encounter: Payer: Self-pay | Admitting: Internal Medicine

## 2021-10-03 ENCOUNTER — Other Ambulatory Visit: Payer: Self-pay

## 2021-10-03 ENCOUNTER — Ambulatory Visit: Payer: BC Managed Care – PPO | Admitting: Internal Medicine

## 2021-10-03 VITALS — BP 130/84 | HR 64 | Ht 67.0 in | Wt 205.0 lb

## 2021-10-03 DIAGNOSIS — E1065 Type 1 diabetes mellitus with hyperglycemia: Secondary | ICD-10-CM | POA: Diagnosis not present

## 2021-10-03 DIAGNOSIS — E063 Autoimmune thyroiditis: Secondary | ICD-10-CM

## 2021-10-03 LAB — MICROALBUMIN / CREATININE URINE RATIO
Creatinine,U: 122.1 mg/dL
Microalb Creat Ratio: 1.2 mg/g (ref 0.0–30.0)
Microalb, Ur: 1.5 mg/dL (ref 0.0–1.9)

## 2021-10-03 LAB — LIPID PANEL
Cholesterol: 158 mg/dL (ref 0–200)
HDL: 45 mg/dL (ref >39.00)
LDL Cholesterol: 100 mg/dL — ABNORMAL HIGH (ref 0–99)
NonHDL: 113
Total CHOL/HDL Ratio: 4
Triglycerides: 67 mg/dL (ref 0.0–149.0)
VLDL: 13.4 mg/dL (ref 0.0–40.0)

## 2021-10-03 LAB — POCT GLUCOSE (DEVICE FOR HOME USE): Glucose Fasting, POC: 175 mg/dL — AB (ref 70–99)

## 2021-10-03 LAB — BASIC METABOLIC PANEL
BUN: 14 mg/dL (ref 6–23)
CO2: 30 mEq/L (ref 19–32)
Calcium: 10.4 mg/dL (ref 8.4–10.5)
Chloride: 100 mEq/L (ref 96–112)
Creatinine, Ser: 1.05 mg/dL (ref 0.40–1.50)
GFR: 99.28 mL/min (ref >60.00)
Glucose, Bld: 98 mg/dL (ref 70–99)
Potassium: 4.1 mEq/L (ref 3.5–5.1)
Sodium: 139 mEq/L (ref 135–145)

## 2021-10-03 LAB — TSH: TSH: 3.3 u[IU]/mL (ref 0.35–5.50)

## 2021-10-03 NOTE — Telephone Encounter (Signed)
Per Access Nurse patient is getting error code on his pump and has contact manufacturer to try and get fixed. Patient has no syringes or pens to give himself insulin. Would like to have back up insulin. Patient does have appt at 3pm this afternoon.

## 2021-10-03 NOTE — Progress Notes (Signed)
Name: Chad Holloway [facility]  Age/ Sex: 26 y.o., male   MRN/ DOB: [id number], 25-Jul-1996     PCP: Eulis Foster, DO   Reason for Endocrinology Evaluation: Type 1 Diabetes Mellitus  Initial Endocrine Consultative Visit: 10/20/2020    PATIENT IDENTIFIER: Chad Holloway is a 26 y.o. male with a past medical history of T1DM, HTN and Hypothyroidism. . The patient has followed with Endocrinology clinic since 10/20/2020 for consultative assistance with management of his diabetes.  DIABETIC HISTORY:  Mr. Seagroves was diagnosed with T1DM in the year 2000 at age 12.started insulin pump therapy in 2009. Started on the T-slim 2020  His hemoglobin A1c has ranged from 7.4% in 2021, peaking at 13.5% in 2017.  In terms of diet, the patient eats 1 meal ( supper) ,snacks through the day   He is a Engineer, site     THYROID HISTORY:  He has been diagnosed with hypothyroidism secondary to Hashimoto's thyroiditis as a child and has been on LT-4 replacement for years.     SUBJECTIVE:   During the last visit (10/20/2020): A1c 7.5% , we adjusted pump setting   Today (10/03/2021): Mr. Both is here for a follow up on diabetes management.   He checks his blood sugars multiple times daily,through CGM.  Unfortunately his pump malfunctioned last night and required basal insulin as well as prandial insulin use.  Currently on Basaglar 30 units daily    Takes LT-4 replacement at night  Denies nausea or nausea     Has a son Alan Mulder 07/2021    Pump settings :     Pump   Tandem  Settings  I:C ratio CF  Insulin type   NOVOLOG       Basal rate            0000-0500  1.05 u/h  12 60    0500-0600  1.40 u/h  12 60   0600- 0800 1.400 12 40    0800-1400  1.25  10 40    1400- 1600 1.125 10 40   1600- 2000 1.250 7 40   2000- 2100 1.050 7 40    [phone] 1.050 9 60               Type & Model of Pump: Tandem Insulin Type: Currently using Novolog           HOME ENDOCRINE REGIMEN:  Levothyroxine 25 mcg daily   Novolog   Statin: no ACE-I/ARB: yes Prior Diabetic Education: yes     DIABETIC COMPLICATIONS: Microvascular complications:   Denies: CKD, retinopathy, neuropathy  Last Eye Exam: Completed 2021  Macrovascular complications:   Denies: CAD, CVA, PVD   HISTORY:  Past Medical History:  Past Medical History:  Diagnosis Date   Diabetes mellitus type I (HCC)    Hypertension    Hypothyroidism    Past Surgical History:  Past Surgical History:  Procedure Laterality Date   INCISION AND DRAINAGE ABSCESS Right 09/14/2018   Procedure: INCISION AND DRAINAGE ABSCESS;  Surgeon: Suzanna Obey, MD;  Location: [facility] OR;  Service: ENT;  Laterality: Right;   Social History:  reports that he has never smoked. He has never used smokeless tobacco. He reports current alcohol use. He reports that he does not use drugs. Family History:  Family History  Problem Relation Age of Onset   Diabetes Mother    Hypertension Mother    Thyroid disease Mother    Obesity Mother    Thyroid disease Father  Diabetes Brother      HOME MEDICATIONS: Allergies as of 10/03/2021   No Known Allergies      Medication List        Accurate as of October 03, 2021  2:50 PM. If you have any questions, ask your nurse or doctor.          Dexcom G6 Sensor Misc 1 each by Does not apply route as directed. 1 sensor every 10 days   Dexcom G6 Transmitter Misc 1 each by Does not apply route every 3 (three) months.   glucagon 1 MG injection Follow package directions for low blood sugar.   glucose blood test strip Use as instructed. Check sugar 6 times per day. For use with linking meter for [employer] pump   HYDROcodone-acetaminophen 5-325 MG tablet Commonly known as: NORCO/VICODIN Take 1-2 tablets by mouth every 4 (four) hours as needed for moderate pain.   insulin aspart 100 UNIT/ML injection Commonly known as: novoLOG Use up to 80 units daily in the insulin pump   insulin glargine 100 UNIT/ML  Solostar Pen Commonly known as: Lantus SoloStar Inject up to 50 units per day   Basaglar KwikPen 100 UNIT/ML Use up to 50 units daily.   Insulin Pen Needle 31G X 5 MM Misc BD Pen Needles- brand specific Inject insulin via insulin pen 7 x daily   levothyroxine 25 MCG tablet Commonly known as: SYNTHROID Take 1 tablet (25 mcg total) by mouth daily before breakfast. 25 mcg of Synthroid per day 4 days per week and 37.5 mcg 3 days per week.   lisinopril 10 MG tablet Commonly known as: ZESTRIL Take 1 tablet (10 mg total) by mouth daily.         OBJECTIVE:   Vital Signs: BP 130/84 (BP Location: Left Arm, Patient Position: Sitting, Cuff Size: Small)    Pulse 64    Ht 5\' 7"  (1.702 m)    Wt 205 lb (93 kg)    SpO2 96%    BMI 32.11 kg/m   Wt Readings from Last 3 Encounters:  10/03/21 205 lb (93 kg)  01/17/21 195 lb 2 oz (88.5 kg)  10/20/20 190 lb 4 oz (86.3 kg)     Exam: General: Pt appears well and is in NAD  Neck: General: Supple without adenopathy. Thyroid: Thyroid size normal.  No goiter or nodules appreciated.   Lungs: Clear with good BS bilat with no rales, rhonchi, or wheezes  Heart: RRR with normal S1 and S2 and no gallops; no murmurs; no rub  Abdomen: Normoactive bowel sounds, soft, nontender, without masses or organomegaly palpable  Extremities: No pretibial edema.   Neuro: MS is good with appropriate affect, pt is alert and Ox3   DM foot exam: 10/20/2020   The skin of the feet is intact without sores or ulcerations. The pedal pulses are 2+ on right and 2+ on left. The sensation is intact to a screening 5.07, 10 gram monofilament bilaterally     DATA REVIEWED:  Lab Results  Component Value Date   HGBA1C 7.4 (A) 01/17/2021   HGBA1C 7.5 (A) 10/20/2020   HGBA1C 7.4 (A) 06/30/2020    Latest Reference Range & Units 10/03/21 15:14  Sodium 135 - 145 mEq/L 139  Potassium 3.5 - 5.1 mEq/L 4.1  Chloride 96 - 112 mEq/L 100  CO2 19 - 32 mEq/L 30  Glucose 70 - 99 mg/dL  98  BUN 6 - 23 mg/dL 14  Creatinine 0.40 - 1.50 mg/dL 1.05  Calcium  8.4 - 10.5 mg/dL 10.4    Latest Reference Range & Units 10/03/21 15:14  GFR >60.00 mL/min 99.28  Total CHOL/HDL Ratio  4  Cholesterol 0 - 200 mg/dL 158  HDL Cholesterol >39.00 mg/dL 45.00  LDL (calc) 0 - 99 mg/dL 100 (H)  MICROALB/CREAT RATIO 0.0 - 30.0 mg/g 1.2  NonHDL  113.00  Triglycerides 0.0 - 149.0 mg/dL 67.0  VLDL 0.0 - 40.0 mg/dL 13.4     Latest Reference Range & Units 10/03/21 15:14  TSH 0.35 - 5.50 uIU/mL 3.30     ASSESSMENT / PLAN / RECOMMENDATIONS:   1) Type 1 Diabetes Mellitus, Sub-Optimally controlled, With out complications - Most recent A1c of 7.8 %. Goal A1c < 7.0 %.     - A1c has trended up , his insulin pump malfunctions last night and currently on MDI regimen  - Will increase basal insulin for now from 30 units to 35 units daily  - He will receive his pump in 2 days  - No change to pump setting at this time   Pump   Tandem  Settings  I:C ratio CF  Insulin type   NOVOLOG       Basal rate            0000-0500  1.05 u/h  12 60    0500-0600  1.40 u/h  12 60   0600- 0800 1.400 12 40    0800-1400  1.25u/h  10 40    1400- 1600 1.125 10 40   1600- 2000 1.250 7 40   2000- 2100 1.050 7 40    [phone] 1.050 9 60     MEDICATIONS: Novolog   EDUCATION / INSTRUCTIONS: BG monitoring instructions: Patient is instructed to check his blood sugars 4 times a day, before meals and beditme. Call [facility] if: BG persistently < 70  I reviewed the Rule of 15 for the treatment of hypoglycemia in detail with the patient. Literature supplied.   2) Diabetic complications:  Eye: Does not have known diabetic retinopathy.  Neuro/ Feet: Does not have known diabetic peripheral neuropathy .  Renal: Patient does not have known baseline CKD. He   is  on an ACEI/ARB at present.     3) Hashimoto's Thyroiditis:  - Pt is clinically euthyroid  - He has tried to take Levothyroxine in  the morning but was forgetting to take it and he is back in taking it at night  - TSH trending up, will increase as below     Stop  levothyroxine 25 mcg  Start levothyroxine 50 mcg daily      F/U in 6 months    Signed electronically by: Mack Guise, MD  [facility]  [facility] [address] [place], [place] [address] Phone: [phone] FAX: [phone]   CC: Delrae Alfred, DO [address] [place] [address] Phone: [phone]  Fax: [phone]  Return to Endocrinology clinic as below: Future Appointments  Date Time Provider [place]  10/03/2021  3:00 PM Lexani Corona, Melanie Crazier, MD [facility] None

## 2021-10-03 NOTE — Patient Instructions (Addendum)
-   Basaglar 35 units daily  - Novolog 1: 12 for Breakfast , 1:12 for Lunch and 1:10 for supper  -Novolog correctional insulin: ADD extra units on insulin to your meal-time Novolog dose if your blood sugars are higher than 170. Use the scale below to help guide you:   Blood sugar before meal Number of units to inject  Less than 170 0 unit  171 -  210 1 units  211 -  250 2 units  251 -  290 3 units  291 -  330 4 units  331 -  370 5 units  371 -  410 6 units   HOW TO TREAT LOW BLOOD SUGARS (Blood sugar LESS THAN 70 MG/DL) Please follow the RULE OF 15 for the treatment of hypoglycemia treatment (when your (blood sugars are less than 70 mg/dL)   STEP 1: Take 15 grams of carbohydrates when your blood sugar is low, which includes:  3-4 GLUCOSE TABS  OR 3-4 OZ OF JUICE OR REGULAR SODA OR ONE TUBE OF GLUCOSE GEL    STEP 2: RECHECK blood sugar in 15 MINUTES STEP 3: If your blood sugar is still low at the 15 minute recheck --> then, go back to STEP 1 and treat AGAIN with another 15 grams of carbohydrates.      Pump   Tandem  Settings  I:C ratio CF  Insulin type   NOVOLOG       Basal rate            0000-0500  1.05 u/h  12 60    0500-0600  1.40 u/h  12 60   0600- 0800 1.400 12 40    0800-1400  1.25  10 40    1400- 1600 1.125 10 40   1600- 2000 1.250 7 40   2000- 2100 1.050 7 40    [phone] 1.050 9 60

## 2021-10-04 ENCOUNTER — Encounter: Payer: Self-pay | Admitting: Internal Medicine

## 2021-10-04 LAB — POCT GLYCOSYLATED HEMOGLOBIN (HGB A1C): Hemoglobin A1C: 7.8 % — AB (ref 4.0–5.6)

## 2021-10-04 MED ORDER — LEVOTHYROXINE SODIUM 50 MCG PO TABS: 50.0000 ug | ORAL_TABLET | Freq: Every day | ORAL | 3 refills | Status: DC

## 2021-10-05 ENCOUNTER — Telehealth: Payer: Self-pay | Admitting: Internal Medicine

## 2021-10-05 NOTE — Telephone Encounter (Signed)
Chad Holloway,   Can you please help the pt with setting up his new tandem pump ASAP? His old one broke    Thanks

## 2021-10-05 NOTE — Telephone Encounter (Signed)
Patient reported that he figured out what the problem was.  He was able to turn his old pump on, and look at all of the settings and transferred them to his new pump.  He reports that his blood sugars are fine now. He has not linked his pump to his phone, but said he would do this this afternoon.  I explained he needed to unlink his old pump first.  He agreed with this and gave me his username and password to his T-connect account, so that I can link him to our computer.  He had no further questions for me.

## 2021-12-27 ENCOUNTER — Encounter: Payer: Self-pay | Admitting: Internal Medicine

## 2021-12-28 ENCOUNTER — Other Ambulatory Visit: Payer: Self-pay

## 2021-12-28 DIAGNOSIS — I10 Essential (primary) hypertension: Secondary | ICD-10-CM

## 2021-12-28 MED ORDER — LISINOPRIL 10 MG PO TABS: 10.0000 mg | ORAL_TABLET | Freq: Every day | ORAL | 5 refills | Status: DC

## 2021-12-28 MED ORDER — INSULIN ASPART 100 UNIT/ML IJ SOLN: INTRAMUSCULAR | 3 refills | Status: DC

## 2022-02-03 ENCOUNTER — Ambulatory Visit: Payer: BC Managed Care – PPO | Admitting: Internal Medicine

## 2022-04-20 ENCOUNTER — Encounter (INDEPENDENT_AMBULATORY_CARE_PROVIDER_SITE_OTHER): Payer: Self-pay

## 2022-06-08 ENCOUNTER — Ambulatory Visit (INDEPENDENT_AMBULATORY_CARE_PROVIDER_SITE_OTHER): Payer: BC Managed Care – PPO | Admitting: Internal Medicine

## 2022-06-08 ENCOUNTER — Encounter: Payer: Self-pay | Admitting: Internal Medicine

## 2022-06-08 VITALS — BP 126/80 | HR 71 | Ht 67.0 in | Wt 208.0 lb

## 2022-06-08 DIAGNOSIS — E1065 Type 1 diabetes mellitus with hyperglycemia: Secondary | ICD-10-CM

## 2022-06-08 DIAGNOSIS — E063 Autoimmune thyroiditis: Secondary | ICD-10-CM

## 2022-06-08 LAB — BASIC METABOLIC PANEL
BUN: 14 mg/dL (ref 6–23)
CO2: 25 mEq/L (ref 19–32)
Calcium: 9.3 mg/dL (ref 8.4–10.5)
Chloride: 102 mEq/L (ref 96–112)
Creatinine, Ser: 0.98 mg/dL (ref 0.40–1.50)
GFR: 107.33 mL/min (ref >60.00)
Glucose, Bld: 195 mg/dL — ABNORMAL HIGH (ref 70–99)
Potassium: 4 mEq/L (ref 3.5–5.1)
Sodium: 136 mEq/L (ref 135–145)

## 2022-06-08 LAB — POCT GLYCOSYLATED HEMOGLOBIN (HGB A1C): Hemoglobin A1C: 8.7 % — AB (ref 4.0–5.6)

## 2022-06-08 NOTE — Progress Notes (Signed)
Name: Chad Holloway  Age/ Sex: 26 y.o., male   MRN/ DOB: [id number], July 07, 1996     PCP: Eulis Foster, DO   Reason for Endocrinology Evaluation: Type 1 Diabetes Mellitus  Initial Endocrine Consultative Visit: 10/20/2020    PATIENT IDENTIFIER: Chad Holloway is a 26 y.o. male with a past medical history of T1DM, HTN and Hypothyroidism. . The patient has followed with Endocrinology clinic since 10/20/2020 for consultative assistance with management of his diabetes.  DIABETIC HISTORY:  Chad Holloway was diagnosed with T1DM in the year 2000 at age 29.started insulin pump therapy in 2009. Started on the T-slim 2020  His hemoglobin A1c has ranged from 7.4% in 2021, peaking at 13.5% in 2017.  In terms of diet, the patient eats 1 meal ( supper) ,snacks through the day   He is a Engineer, site     THYROID HISTORY:  He has been diagnosed with hypothyroidism secondary to Hashimoto's thyroiditis as a child and has been on LT-4 replacement for years.     SUBJECTIVE:   During the last visit (10/03/2021): A1c 7.8%   Today (06/08/2022): Chad Holloway is here for a follow up on diabetes management.   He checks his blood sugars multiple times daily,through CGM.   Takes LT-4 replacement at night  Denies nausea or nausea     Has a son Alan Mulder 07/2021    Pump settings :     Pump   Tandem  Settings  I:C ratio CF  Insulin type   NOVOLOG       Basal rate            0000 1.05 u/h  12 60    0500 1.40 u/h  12 60   0600 1.400 12 40    0800 1.25  10 40    1400 1.125 10 40   1600 1.250 7 40   2000 1.050 7 40    2100 1.050 9 60               Type & Model of Pump: Tandem Insulin Type: Currently using Novolog       PUMP STATISTICS: Average BG: 206 Average Daily Carbs (g): 301  Average Total Daily Insulin: 70.63  Average Daily Basal: 38 (47 %) Average Daily Bolus: 42 (53 %)     HOME ENDOCRINE REGIMEN:  Levothyroxine 25 mcg daily  Novolog   Statin: no ACE-I/ARB: yes Prior Diabetic  Education: yes     DIABETIC COMPLICATIONS: Microvascular complications:   Denies: CKD, retinopathy, neuropathy  Last Eye Exam: Completed 2021  Macrovascular complications:   Denies: CAD, CVA, PVD   HISTORY:  Past Medical History:  Past Medical History:  Diagnosis Date   Diabetes mellitus type I (HCC)    Hypertension    Hypothyroidism    Past Surgical History:  Past Surgical History:  Procedure Laterality Date   INCISION AND DRAINAGE ABSCESS Right 09/14/2018   Procedure: INCISION AND DRAINAGE ABSCESS;  Surgeon: Suzanna Obey, MD;  Location: [facility];  Service: ENT;  Laterality: Right;   Social History:  reports that he has never smoked. He has never used smokeless tobacco. He reports current alcohol use. He reports that he does not use drugs. Family History:  Family History  Problem Relation Age of Onset   Diabetes Mother    Hypertension Mother    Thyroid disease Mother    Obesity Mother    Thyroid disease Father    Diabetes Brother  HOME MEDICATIONS: Allergies as of 06/08/2022   No Known Allergies      Medication List        Accurate as of June 08, 2022 10:10 AM. If you have any questions, ask your nurse or doctor.          STOP taking these medications    Basaglar KwikPen 100 UNIT/ML Stopped by: Scarlette Shorts, MD       TAKE these medications    Dexcom G6 Sensor Misc 1 each by Does not apply route as directed. 1 sensor every 10 days   Dexcom G6 Transmitter Misc 1 each by Does not apply route every 3 (three) months.   glucagon 1 MG injection Follow package directions for low blood sugar.   glucose blood test strip Use as instructed. Check sugar 6 times per day. For use with linking meter for Medtron[employer] pump   HYDROcodone-acetaminophen 5-325 MG tablet Commonly known as: NORCO/VICODIN Take 1-2 tablets by mouth every 4 (four) hours as needed for moderate pain.   insulin aspart 100 UNIT/ML injection Commonly known as:  novoLOG Use up to 80 units daily in the insulin pump   Insulin Pen Needle 31G X 5 MM Misc BD Pen Needles- brand specific Inject insulin via insulin pen 7 x daily   levothyroxine 50 MCG tablet Commonly known as: SYNTHROID Take 1 tablet (50 mcg total) by mouth daily.   lisinopril 10 MG tablet Commonly known as: ZESTRIL Take 1 tablet (10 mg total) by mouth daily.         OBJECTIVE:   Vital Signs: BP 126/80 (BP Location: Left Arm, Patient Position: Sitting, Cuff Size: Small)   Pulse 71   Ht 5\' 7"  (1.702 m)   Wt 208 lb (94.3 kg)   SpO2 96%   BMI 32.58 kg/m   Wt Readings from Last 3 Encounters:  06/08/22 208 lb (94.3 kg)  10/03/21 205 lb (93 kg)  01/17/21 195 lb 2 oz (88.5 kg)     Exam: General: Pt appears well and is in NAD  Neck: General: Supple without adenopathy. Thyroid: Thyroid size normal.  No goiter or nodules appreciated.   Lungs: Clear with good BS bilat with no rales, rhonchi, or wheezes  Heart: RRR   Abdomen: Normoactive bowel sounds, soft, nontender, without masses or organomegaly palpable  Extremities: No pretibial edema.   Neuro: MS is good with appropriate affect, pt is alert and Ox3   DM foot exam: 06/08/2022   The skin of the feet is intact without sores or ulcerations. The pedal pulses are 2+ on right and 2+ on left. The sensation is intact to a screening 5.07, 10 gram monofilament bilaterally     DATA REVIEWED:  Lab Results  Component Value Date   HGBA1C 7.8 (A) 10/04/2021   HGBA1C 7.4 (A) 01/17/2021   HGBA1C 7.5 (A) 10/20/2020    Latest Reference Range & Units 06/08/22 10:30  Sodium 135 - 145 mEq/L 136  Potassium 3.5 - 5.1 mEq/L 4.0  Chloride 96 - 112 mEq/L 102  CO2 19 - 32 mEq/L 25  Glucose 70 - 99 mg/dL 06/10/22 (H)  BUN 6 - 23 mg/dL 14  Creatinine 998 - 3.38 mg/dL 2.50  Calcium 8.4 - 5.39 mg/dL 9.3  GFR 76.7 mL/min 107.33    Latest Reference Range & Units 06/08/22 10:30  TSH 0.35 - 5.50 uIU/mL 2.15       Latest Reference  Range & Units 10/03/21 15:14  GFR >60.00 mL/min 99.28  Total  CHOL/HDL Ratio  4  Cholesterol 0 - 200 mg/dL 158  HDL Cholesterol >39.00 mg/dL 45.00  LDL (calc) 0 - 99 mg/dL 100 (H)  MICROALB/CREAT RATIO 0.0 - 30.0 mg/g 1.2  NonHDL  113.00  Triglycerides 0.0 - 149.0 mg/dL 67.0  VLDL 0.0 - 40.0 mg/dL 13.4       ASSESSMENT / PLAN / RECOMMENDATIONS:   1) Type 1 Diabetes Mellitus, Sub-Optimally controlled, With out complications - Most recent A1c of 8.7  %. Goal A1c < 7.0 %.     -Patient with hyperglycemia -In reviewing tandem download, patient has been noted with postprandial hyperglycemia, this is mainly due to the fact that he is not consistently bolusing for carbohydrate intake before the meals, and with of the time is spent trying to correct these highs, we again discussed the importance of taking the time before each meal to enter the carbohydrates -I will change his basal rate overnight as he has been noted with consistent hyperglycemia during that time -I am also going to adjust his insulin to carb ratio and sensitivity factor as below    Pump   Tandem  Settings  I:C ratio CF  Insulin type   NOVOLOG       Basal rate            0000 1.1 u/h  12 50    0500 1.40 u/h  12 60   0600 1.400 12 40    0800 1.25  10 40    1400 1.125 10 40   1600 1.250 6 40   2000 1.050 6 40    2100 1.050 8 50          MEDICATIONS: Novolog   EDUCATION / INSTRUCTIONS: BG monitoring instructions: Patient is instructed to check his blood sugars 4 times a day, before meals and beditme. Call [facility] if: BG persistently < 70  I reviewed the Rule of 15 for the treatment of hypoglycemia in detail with the patient. Literature supplied.   2) Diabetic complications:  Eye: Does not have known diabetic retinopathy.  Neuro/ Feet: Does not have known diabetic peripheral neuropathy .  Renal: Patient does not have known baseline CKD. He   is  on an ACEI/ARB at present.     3)  Hashimoto's Thyroiditis:  - Pt is clinically euthyroid  - He has tried to take Levothyroxine in the morning but was forgetting to take it and he is back in taking it at night  -TSH is normal    Continue levothyroxine 50 mcg daily      F/U in 6 months    Signed electronically by: Mack Guise, MD  [facility] Endocrinology  [address] [place], [place] [address] Phone: [phone] FAX: [phone]   CC: Delrae Alfred, DO [place] [address] Phone: [phone]  Fax: [phone]  Return to Endocrinology clinic as below: No future appointments.

## 2022-06-08 NOTE — Patient Instructions (Signed)
    Pump   Tandem  Settings  I:C ratio CF  Basal rate            0000 1.1 u/h  12 50    0500 1.40 u/h  12 60   0600 1.400 12 40    0800 1.25  10 40    1400 1.125 10 40   1600 1.250 6 40   2000 1.050 6 40    2100 1.050 8 50      HOW TO TREAT LOW BLOOD SUGARS (Blood sugar LESS THAN 70 MG/DL) Please follow the RULE OF 15 for the treatment of hypoglycemia treatment (when your (blood sugars are less than 70 mg/dL)   STEP 1: Take 15 grams of carbohydrates when your blood sugar is low, which includes:  3-4 GLUCOSE TABS  OR 3-4 OZ OF JUICE OR REGULAR SODA OR ONE TUBE OF GLUCOSE GEL    STEP 2: RECHECK blood sugar in 15 MINUTES STEP 3: If your blood sugar is still low at the 15 minute recheck --> then, go back to STEP 1 and treat AGAIN with another 15 grams of carbohydrates.

## 2022-06-09 ENCOUNTER — Encounter: Payer: Self-pay | Admitting: Internal Medicine

## 2022-06-09 LAB — TSH: TSH: 2.15 u[IU]/mL (ref 0.35–5.50)

## 2022-06-09 MED ORDER — LEVOTHYROXINE SODIUM 50 MCG PO TABS: 50.0000 ug | ORAL_TABLET | Freq: Every day | ORAL | 3 refills | Status: DC

## 2022-12-06 ENCOUNTER — Other Ambulatory Visit: Payer: Self-pay | Admitting: Internal Medicine

## 2022-12-07 ENCOUNTER — Ambulatory Visit: Payer: BC Managed Care – PPO | Admitting: Internal Medicine

## 2023-02-08 ENCOUNTER — Other Ambulatory Visit: Payer: Self-pay | Admitting: Internal Medicine

## 2023-02-08 DIAGNOSIS — I10 Essential (primary) hypertension: Secondary | ICD-10-CM

## 2023-02-08 MED ORDER — LISINOPRIL 10 MG PO TABS: 10.0000 mg | ORAL_TABLET | Freq: Every day | ORAL | 3 refills | Status: DC

## 2023-03-03 ENCOUNTER — Encounter: Payer: Self-pay | Admitting: Internal Medicine

## 2023-03-04 ENCOUNTER — Other Ambulatory Visit: Payer: Self-pay | Admitting: Internal Medicine

## 2023-03-05 ENCOUNTER — Other Ambulatory Visit: Payer: Self-pay | Admitting: Internal Medicine

## 2023-03-05 MED ORDER — NOVOLOG 100 UNIT/ML IJ SOLN: INTRAMUSCULAR | 0 refills | Status: DC

## 2023-03-23 ENCOUNTER — Encounter (INDEPENDENT_AMBULATORY_CARE_PROVIDER_SITE_OTHER): Payer: Self-pay

## 2023-05-27 ENCOUNTER — Other Ambulatory Visit: Payer: Self-pay | Admitting: Internal Medicine

## 2023-06-11 ENCOUNTER — Ambulatory Visit: Payer: BC Managed Care – PPO | Admitting: Internal Medicine

## 2023-06-11 ENCOUNTER — Encounter: Payer: Self-pay | Admitting: Internal Medicine

## 2023-06-11 VITALS — BP 122/80 | HR 65 | Ht 67.0 in | Wt 212.0 lb

## 2023-06-11 DIAGNOSIS — E1065 Type 1 diabetes mellitus with hyperglycemia: Secondary | ICD-10-CM

## 2023-06-11 DIAGNOSIS — E063 Autoimmune thyroiditis: Secondary | ICD-10-CM

## 2023-06-11 DIAGNOSIS — I10 Essential (primary) hypertension: Secondary | ICD-10-CM | POA: Diagnosis not present

## 2023-06-11 LAB — BASIC METABOLIC PANEL
BUN: 13 mg/dL (ref 6–23)
CO2: 23 mEq/L (ref 19–32)
Calcium: 9.4 mg/dL (ref 8.4–10.5)
Chloride: 106 mEq/L (ref 96–112)
Creatinine, Ser: 0.92 mg/dL (ref 0.40–1.50)
GFR: 114.97 mL/min (ref >60.00)
Glucose, Bld: 153 mg/dL — ABNORMAL HIGH (ref 70–99)
Potassium: 4 mEq/L (ref 3.5–5.1)
Sodium: 138 mEq/L (ref 135–145)

## 2023-06-11 LAB — LIPID PANEL
Cholesterol: 127 mg/dL (ref 0–200)
HDL: 34.9 mg/dL — ABNORMAL LOW (ref >39.00)
LDL Cholesterol: 82 mg/dL (ref 0–99)
NonHDL: 92.58
Total CHOL/HDL Ratio: 4
Triglycerides: 51 mg/dL (ref 0.0–149.0)
VLDL: 10.2 mg/dL (ref 0.0–40.0)

## 2023-06-11 LAB — MICROALBUMIN / CREATININE URINE RATIO
Creatinine,U: 147.6 mg/dL
Microalb Creat Ratio: 1.6 mg/g (ref 0.0–30.0)
Microalb, Ur: 2.4 mg/dL — ABNORMAL HIGH (ref 0.0–1.9)

## 2023-06-11 LAB — POCT GLYCOSYLATED HEMOGLOBIN (HGB A1C): Hemoglobin A1C: 8 % — AB (ref 4.0–5.6)

## 2023-06-11 LAB — TSH: TSH: 2.07 u[IU]/mL (ref 0.35–5.50)

## 2023-06-11 NOTE — Patient Instructions (Signed)

## 2023-06-11 NOTE — Progress Notes (Unsigned)
Name: Colman Whale [facility]  Age/ Sex: 27 y.o., male   MRN/ DOB: [id number], 1996/06/14     PCP: Eulis Foster, DO   Reason for Endocrinology Evaluation: Type 1 Diabetes Mellitus  Initial Endocrine Consultative Visit: 10/20/2020    PATIENT IDENTIFIER: Mr. MARY GAVRILOV is a 27 y.o. male with a past medical history of T1DM, HTN and Hypothyroidism. . The patient has followed with Endocrinology clinic since 10/20/2020 for consultative assistance with management of his diabetes.  DIABETIC HISTORY:  Mr. Jenson was diagnosed with T1DM in the year 2000 at age 8.started insulin pump therapy in 2009. Started on the T-slim 2020  His hemoglobin A1c has ranged from 7.4% in 2021, peaking at 13.5% in 2017.  In terms of diet, the patient eats 1 meal ( supper) ,snacks through the day   He is a Engineer, site     THYROID HISTORY:  He has been diagnosed with hypothyroidism secondary to Hashimoto's thyroiditis as a child and has been on LT-4 replacement for years.     SUBJECTIVE:   During the last visit (06/08/2022): A1c 7.8%   Today (06/11/2023): Mr. Silverstone is here for a follow up on diabetes management. He has NOT been to our clinic in 1 yr.   He checks his blood sugars multiple times daily,through CGM. He  as noted with hypoglycemia, he is symptomatic with these episodes.     Takes LT-4 replacement at night  Denies nausea or nausea  Denies constipation or diarrhea    He is a Corporate treasurer for 2 schools [employer]    Pump settings :     Pump   Tandem  Settings  I:C ratio CF  Insulin type   NOVOLOG       Basal rate            0000 1.10 u/h  12 50    0500 1.40 u/h  12 60   0600 1.400 12 40    0800 1.250 10 40    1400 1.125 10 40   1600 1.250 6 40   2000 1.050 6 40    2100 1.050 8 50               Type & Model of Pump: Tandem Insulin Type: Currently using Novolog       PUMP STATISTICS: Average BG: 206 Average Daily Carbs (g): 301  Average Total Daily Insulin: 70.63   Average Daily Basal: 38 (47 %) Average Daily Bolus: 42 (53 %)     HOME ENDOCRINE REGIMEN:  Levothyroxine 25 mcg daily  Novolog   Statin: no ACE-I/ARB: yes Prior Diabetic Education: yes     DIABETIC COMPLICATIONS: Microvascular complications:   Denies: CKD, retinopathy, neuropathy  Last Eye Exam: Completed 2021  Macrovascular complications:   Denies: CAD, CVA, PVD   HISTORY:  Past Medical History:  Past Medical History:  Diagnosis Date   Diabetes mellitus type I (HCC)    Hypertension    Hypothyroidism    Past Surgical History:  Past Surgical History:  Procedure Laterality Date   INCISION AND DRAINAGE ABSCESS Right 09/14/2018   Procedure: INCISION AND DRAINAGE ABSCESS;  Surgeon: Suzanna Obey, MD;  Location: [facility] OR;  Service: ENT;  Laterality: Right;   Social History:  reports that he has never smoked. He has never used smokeless tobacco. He reports current alcohol use. He reports that he does not use drugs. Family History:  Family History  Problem Relation Age of Onset   Diabetes Mother  Hypertension Mother    Thyroid disease Mother    Obesity Mother    Thyroid disease Father    Diabetes Brother      HOME MEDICATIONS: Allergies as of 06/11/2023   No Known Allergies      Medication List        Accurate as of June 11, 2023 10:08 AM. If you have any questions, ask your nurse or doctor.          Dexcom G6 Sensor Misc 1 each by Does not apply route as directed. 1 sensor every 10 days   Dexcom G6 Transmitter Misc 1 each by Does not apply route every 3 (three) months.   glucagon 1 MG injection Follow package directions for low blood sugar.   glucose blood test strip Use as instructed. Check sugar 6 times per day. For use with linking meter for [employer]tron[employer] pump   HYDROcodone-acetaminophen 5-325 MG tablet Commonly known as: NORCO/VICODIN Take 1-2 tablets by mouth every 4 (four) hours as needed for moderate pain.   Insulin Pen Needle 31G  X 5 MM Misc BD Pen Needles- brand specific Inject insulin via insulin pen 7 x daily   levothyroxine 50 MCG tablet Commonly known as: SYNTHROID Take 1 tablet by mouth once daily   lisinopril 10 MG tablet Commonly known as: ZESTRIL Take 1 tablet (10 mg total) by mouth daily.   NovoLOG 100 UNIT/ML injection Generic drug: insulin aspart INJECT UP TO 80 UNITS DAILY IN INSULIN PUMP         OBJECTIVE:   Vital Signs: BP 122/80 (BP Location: Left Arm, Patient Position: Sitting, Cuff Size: Large)   Pulse 65   Ht 5\' 7"  (1.702 m)   Wt 212 lb (96.2 kg)   SpO2 99%   BMI 33.20 kg/m   Wt Readings from Last 3 Encounters:  06/11/23 212 lb (96.2 kg)  06/08/22 208 lb (94.3 kg)  10/03/21 205 lb (93 kg)     Exam: General: Pt appears well and is in NAD  Neck: General: Supple without adenopathy. Thyroid: Thyroid size normal.  No goiter or nodules appreciated.   Lungs: Clear with good BS bilat with no rales, rhonchi, or wheezes  Heart: RRR   Abdomen: Normoactive bowel sounds, soft, nontender, without masses or organomegaly palpable  Extremities: No pretibial edema.   Neuro: MS is good with appropriate affect, pt is alert and Ox3   DM foot exam: 06/11/2023   The skin of the feet is intact without sores or ulcerations. The pedal pulses are 2+ on right and 2+ on left. The sensation is intact to a screening 5.07, 10 gram monofilament bilaterally     DATA REVIEWED:  Lab Results  Component Value Date   HGBA1C 8.7 (A) 06/08/2022   HGBA1C 7.8 (A) 10/04/2021   HGBA1C 7.4 (A) 01/17/2021    Latest Reference Range & Units 06/08/22 10:30  Sodium 135 - 145 mEq/L 136  Potassium 3.5 - 5.1 mEq/L 4.0  Chloride 96 - 112 mEq/L 102  CO2 19 - 32 mEq/L 25  Glucose 70 - 99 mg/dL 161 (H)  BUN 6 - 23 mg/dL 14  Creatinine 0.96 - 0.45 mg/dL 4.09  Calcium 8.4 - 81.1 mg/dL 9.3  GFR >91.47 mL/min 107.33    Latest Reference Range & Units 06/08/22 10:30  TSH 0.35 - 5.50 uIU/mL 2.15       Latest  Reference Range & Units 10/03/21 15:14  GFR >60.00 mL/min 99.28  Total CHOL/HDL Ratio  4  Cholesterol  0 - 200 mg/dL 010  HDL Cholesterol >27.25 mg/dL 36.64  LDL (calc) 0 - 99 mg/dL 403 (H)  MICROALB/CREAT RATIO 0.0 - 30.0 mg/g 1.2  NonHDL  113.00  Triglycerides 0.0 - 149.0 mg/dL 47.4  VLDL 0.0 - 25.9 mg/dL 56.3       ASSESSMENT / PLAN / RECOMMENDATIONS:   1) Type 1 Diabetes Mellitus, poorly  controlled, With out complications - Most recent A1c of 8.7  %. Goal A1c < 7.0 %.     -Patient with hyperglycemia -In reviewing tandem download, patient has been noted with postprandial hyperglycemia, this is mainly due to the fact that he is not consistently bolusing for carbohydrate intake before the meals, and with of the time is spent trying to correct these highs, we again discussed the importance of taking the time before each meal to enter the carbohydrates -I will change his basal rate overnight as he has been noted with consistent hyperglycemia during that time -I am also going to adjust his insulin to carb ratio and sensitivity factor as below      Pump   Tandem  Settings  I:C ratio CF  Insulin type   NOVOLOG       Basal rate            0000 1.10 u/h  12 45    0500 1.40 u/h  12 55   0600 1.400 12 45    0800 1.250 9 45    1400 1.125 9 45   1600 1.250 5 40   2000 1.050 5 40    2100 1.050 7 50    MEDICATIONS: Novolog   EDUCATION / INSTRUCTIONS: BG monitoring instructions: Patient is instructed to check his blood sugars 4 times a day, before meals and beditme. Call [facility] if: BG persistently < 70  I reviewed the Rule of 15 for the treatment of hypoglycemia in detail with the patient. Literature supplied.   2) Diabetic complications:  Eye: Does not have known diabetic retinopathy.  Neuro/ Feet: Does not have known diabetic peripheral neuropathy .  Renal: Patient does not have known baseline CKD. He   is  on an ACEI/ARB at present.     3)  Hashimoto's Thyroiditis:  - Pt is clinically euthyroid  - He has tried to take Levothyroxine in the morning but was forgetting to take it and he is back in taking it at night  -TSH is normal    Continue levothyroxine 50 mcg daily      F/U in 6 months    Signed electronically by: Lyndle Herrlich, MD  [facility] Endocrinology  [facility] [address] [place], [place] [address] Phone: [phone] FAX: [phone]   CC: Eulis Foster, DO [address] [place] [address] Phone: [phone]  Fax: [phone]  Return to Endocrinology clinic as below: No future appointments.

## 2023-06-12 MED ORDER — DEXCOM G7 SENSOR MISC: 1.0000 | 3 refills | Status: DC

## 2023-06-12 MED ORDER — LEVOTHYROXINE SODIUM 50 MCG PO TABS: 50.0000 ug | ORAL_TABLET | Freq: Every day | ORAL | 3 refills | Status: DC

## 2023-06-12 MED ORDER — LISINOPRIL 10 MG PO TABS
10.0000 mg | ORAL_TABLET | Freq: Every day | ORAL | 3 refills | Status: DC
Start: 2023-06-12 — End: 2024-01-22

## 2023-06-12 MED ORDER — NOVOLOG 100 UNIT/ML IJ SOLN: INTRAMUSCULAR | 3 refills | Status: DC

## 2023-06-13 ENCOUNTER — Encounter: Payer: Self-pay | Admitting: Internal Medicine

## 2023-07-03 ENCOUNTER — Telehealth: Payer: Self-pay

## 2023-07-03 ENCOUNTER — Other Ambulatory Visit (HOSPITAL_COMMUNITY): Payer: Self-pay

## 2023-07-03 NOTE — Telephone Encounter (Signed)
Can we process PA on the [facility]

## 2023-07-03 NOTE — Telephone Encounter (Signed)
Pharmacy Patient Advocate Encounter   Received notification from Pt Calls Messages that prior authorization for Dexcom g7 sensor is required/requested.   Per test claim: PA required; PA started via CoverMyMeds. KEY [id number] . Waiting for clinical questions to populate.

## 2023-07-16 NOTE — Telephone Encounter (Signed)
Any update on this PA?

## 2023-07-17 ENCOUNTER — Other Ambulatory Visit (HOSPITAL_COMMUNITY): Payer: Self-pay

## 2023-07-19 MED ORDER — DEXCOM G7 SENSOR MISC: 1.0000 | 3 refills | Status: DC

## 2023-07-19 NOTE — Addendum Note (Signed)
Addended by: Lisabeth Pick on: 07/19/2023 11:54 AM   Modules accepted: Orders

## 2023-07-19 NOTE — Telephone Encounter (Signed)
Pharmacy Patient Advocate Encounter  Received notification from [facility] that Prior Authorization for Dexcom G7 sensor has been APPROVED through 07/14/2024   PA #/Case ID/Reference #: [id number]

## 2023-08-30 ENCOUNTER — Telehealth: Payer: Self-pay | Admitting: Internal Medicine

## 2023-08-30 NOTE — Telephone Encounter (Signed)
Patients mother dropped of [place] DMV Paperwork for completion. Placed in MD folder at front desk

## 2023-09-05 NOTE — Telephone Encounter (Signed)
Left vm that Dr. Lonzo Cloud portion has been completed and paperwork has been put in folder up front.

## 2023-09-06 NOTE — Telephone Encounter (Signed)
Paperwork picked up by Mom - faxed to [facility] DMV at her request regardless of anything other than Endo portion being completed

## 2023-10-04 ENCOUNTER — Other Ambulatory Visit: Payer: Self-pay

## 2023-10-04 ENCOUNTER — Telehealth: Payer: Self-pay | Admitting: Internal Medicine

## 2023-10-04 DIAGNOSIS — E1065 Type 1 diabetes mellitus with hyperglycemia: Secondary | ICD-10-CM

## 2023-10-04 MED ORDER — DEXCOM G7 SENSOR MISC: 1.0000 | 3 refills | Status: DC

## 2023-10-04 MED ORDER — NOVOLOG 100 UNIT/ML IJ SOLN: INTRAMUSCULAR | 3 refills | Status: DC

## 2023-10-04 NOTE — Telephone Encounter (Signed)
MEDICATION: All diabetic supplies  PHARMACY:  [facility]  HAS THE PATIENT CONTACTED THEIR PHARMACY?  Yes  IS THIS A 90 DAY SUPPLY : Yes  IS PATIENT OUT OF MEDICATION: No  IF NOT; HOW MUCH IS LEFT: On some supplies he is out (Sensors)  LAST APPOINTMENT DATE: @9 /23/2024  NEXT APPOINTMENT DATE:@3 /17/2025  DO WE HAVE YOUR PERMISSION TO LEAVE A DETAILED MESSAGE?:Yes  OTHER COMMENTS: Patient states that he uses [facility] for all of his supplies and prefers this over [facility].  Per patient Felisa Bonier has sent in 2 requests.   **Let patient know to contact pharmacy at the end of the day to make sure medication is ready. **  ** Please notify patient to allow 48-72 hours to process**  **Encourage patient to contact the pharmacy for refills or they can request refills through [facility]**

## 2023-10-05 ENCOUNTER — Other Ambulatory Visit: Payer: Self-pay

## 2023-10-05 DIAGNOSIS — E1065 Type 1 diabetes mellitus with hyperglycemia: Secondary | ICD-10-CM

## 2023-10-05 MED ORDER — NOVOLOG 100 UNIT/ML IJ SOLN: INTRAMUSCULAR | 3 refills | Status: DC

## 2023-10-05 NOTE — Telephone Encounter (Signed)
Insulin has been sent back to pharmacy as Chad Holloway doesn't process this. Patient would like for the pump supplies and Dexcom 6 sent to [facility] which has been completed.  Patient would like to continue on G6 until his appointment

## 2023-12-03 ENCOUNTER — Ambulatory Visit: Payer: BC Managed Care – PPO | Admitting: Internal Medicine

## 2024-01-22 ENCOUNTER — Other Ambulatory Visit: Payer: Self-pay

## 2024-01-22 ENCOUNTER — Encounter: Payer: Self-pay | Admitting: Internal Medicine

## 2024-01-22 ENCOUNTER — Ambulatory Visit: Admitting: Internal Medicine

## 2024-01-22 VITALS — BP 126/74 | HR 83 | Ht 67.0 in | Wt 211.0 lb

## 2024-01-22 DIAGNOSIS — E1065 Type 1 diabetes mellitus with hyperglycemia: Secondary | ICD-10-CM

## 2024-01-22 DIAGNOSIS — I10 Essential (primary) hypertension: Secondary | ICD-10-CM

## 2024-01-22 DIAGNOSIS — E063 Autoimmune thyroiditis: Secondary | ICD-10-CM | POA: Diagnosis not present

## 2024-01-22 LAB — POCT GLYCOSYLATED HEMOGLOBIN (HGB A1C): Hemoglobin A1C: 7.5 % — AB (ref 4.0–5.6)

## 2024-01-22 MED ORDER — INSULIN ASPART 100 UNIT/ML IJ SOLN
INTRAMUSCULAR | 3 refills | Status: DC
Start: 2024-01-22 — End: 2024-07-22

## 2024-01-22 MED ORDER — DEXCOM G7 SENSOR MISC: 1.0000 | 3 refills | Status: AC

## 2024-01-22 MED ORDER — LISINOPRIL 10 MG PO TABS: 10.0000 mg | ORAL_TABLET | Freq: Every day | ORAL | 3 refills | Status: AC

## 2024-01-22 MED ORDER — DEXCOM G7 SENSOR MISC
1.0000 | 3 refills | Status: DC
Start: 2024-01-22 — End: 2024-01-22

## 2024-01-22 MED ORDER — LEVOTHYROXINE SODIUM 50 MCG PO TABS: 50.0000 ug | ORAL_TABLET | Freq: Every day | ORAL | 3 refills | Status: DC

## 2024-01-22 NOTE — Patient Instructions (Signed)

## 2024-01-22 NOTE — Progress Notes (Unsigned)
 Name: Chad Holloway [facility]  Age/ Sex: 28 y.o., male   MRN/ DOB: [id number], Sep 30, 1995     PCP: Janelle Mediate, DO   Reason for Endocrinology Evaluation: Type 1 Diabetes Mellitus  Initial Endocrine Consultative Visit: 10/20/2020    PATIENT IDENTIFIER: Chad Holloway is a 28 y.o. male with a past medical history of T1DM, HTN and Hypothyroidism. . The patient has followed with Endocrinology clinic since 10/20/2020 for consultative assistance with management of his diabetes.  DIABETIC HISTORY:  Chad Holloway was diagnosed with T1DM in the year 2000 at age 55.started insulin  pump therapy in 2009. Started on the T-slim 2020  His hemoglobin A1c has ranged from 7.4% in 2021, peaking at 13.5% in 2017.  In terms of diet, the patient eats 1 meal ( supper) ,snacks through the day   He is a Engineer, site     THYROID  HISTORY:  He has been diagnosed with hypothyroidism secondary to Hashimoto's thyroiditis as a child and has been on LT-4 replacement for years.     SUBJECTIVE:   During the last visit (06/11/2023): A1c 8.0%     Today (01/22/2024): Chad Holloway is here for a follow up on diabetes management.  He checks his blood sugars multiple times daily,through CGM. He as noted with hypoglycemia, he is symptomatic with these episodes.    Denies nausea or nausea  Denies constipation or diarrhea  Denies palpitations     Pump settings :    Pump   Tandem  Settings  I:C ratio CF  Insulin  type   NOVOLOG        Basal rate            0000 1.10 u/h  10 45    0500 1.40 u/h  10 50   0600 1.400 10 45    0800 1.250 8 45    1400 1.125 8 45   1600 1.250 5 45   2000 1.050 5 40    2100 1.050 6 50       Type & Model of Pump: Tandem Insulin  Type: Currently using Novolog        PUMP STATISTICS: Average BG: 164 Average Daily Carbs (g): 227 Average Total Daily Insulin : 70.34 Average Daily Basal: 30.98(44 %) Average Daily Bolus: 39.36 (56 %)   CONTINUOUS GLUCOSE MONITORING RECORD INTERPRETATION    Dates  of Recording: 4/23-01/21/2024  Sensor description: Dexcom  Results statistics:   CGM use % of time 93  Average and SD 164/56  Time in range 65   %  % Time Above 180 24  % Time Below target 1.6   Glycemic patterns summary: BGs are optimal overnight and fluctuate during the day  Hyperglycemic episodes postprandial  Hypoglycemic episodes occurred N/A  Overnight periods: Optimal    HOME ENDOCRINE REGIMEN:  Levothyroxine  25 mcg daily  Novolog    Statin: no ACE-I/ARB: yes Prior Diabetic Education: yes     DIABETIC COMPLICATIONS: Microvascular complications:   Denies: CKD, retinopathy, neuropathy  Last Eye Exam: Completed 2021  Macrovascular complications:   Denies: CAD, CVA, PVD   HISTORY:  Past Medical History:  Past Medical History:  Diagnosis Date   Diabetes mellitus type I (HCC)    Hypertension    Hypothyroidism    Past Surgical History:  Past Surgical History:  Procedure Laterality Date   INCISION AND DRAINAGE ABSCESS Right 09/14/2018   Procedure: INCISION AND DRAINAGE ABSCESS;  Surgeon: Vernadine Golas, MD;  Location: [facility] OR;  Service: ENT;  Laterality: Right;   Social  History:  reports that he has never smoked. He has never used smokeless tobacco. He reports current alcohol use. He reports that he does not use drugs. Family History:  Family History  Problem Relation Age of Onset   Diabetes Mother    Hypertension Mother    Thyroid  disease Mother    Obesity Mother    Thyroid  disease Father    Diabetes Brother      HOME MEDICATIONS: Allergies as of 01/22/2024   No Known Allergies      Medication List        Accurate as of Jan 22, 2024 11:51 AM. If you have any questions, ask your nurse or doctor.          Dexcom G7 Sensor Misc 1 Device by Does not apply route as directed.   glucagon  1 MG injection Follow package directions for low blood sugar.   glucose blood test strip Use as instructed. Check sugar 6 times per day. For use with  linking meter for [employer] pump   HYDROcodone -acetaminophen  5-325 MG tablet Commonly known as: NORCO/VICODIN Take 1-2 tablets by mouth every 4 (four) hours as needed for moderate pain.   levothyroxine  50 MCG tablet Commonly known as: SYNTHROID  Take 1 tablet (50 mcg total) by mouth daily.   lisinopril  10 MG tablet Commonly known as: ZESTRIL  Take 1 tablet (10 mg total) by mouth daily.   NovoLOG  100 UNIT/ML injection Generic drug: insulin  aspart INJECT UP TO 80 UNITS DAILY IN INSULIN  PUMP         OBJECTIVE:   Vital Signs: BP 126/74 (BP Location: Left Arm, Patient Position: Sitting, Cuff Size: Normal)   Pulse 83   Ht 5\' 7"  (1.702 m)   Wt 211 lb (95.7 kg)   SpO2 96%   BMI 33.05 kg/m   Wt Readings from Last 3 Encounters:  01/22/24 211 lb (95.7 kg)  06/11/23 212 lb (96.2 kg)  06/08/22 208 lb (94.3 kg)     Exam: General: Pt appears well and is in NAD  Neck: General: Supple without adenopathy. Thyroid : Thyroid  size normal.  No goiter or nodules appreciated.   Lungs: Clear with good BS bilat with no rales, rhonchi, or wheezes  Heart: RRR   Abdomen: Normoactive bowel sounds, soft, nontender, without masses or organomegaly palpable  Extremities: No pretibial edema.   Neuro: MS is good with appropriate affect, pt is alert and Ox3   DM foot exam: 06/11/2023   The skin of the feet is intact without sores or ulcerations. The pedal pulses are 2+ on right and 2+ on left. The sensation is intact to a screening 5.07, 10 gram monofilament bilaterally     DATA REVIEWED:  Lab Results  Component Value Date   HGBA1C 8.0 (A) 06/11/2023   HGBA1C 8.7 (A) 06/08/2022   HGBA1C 7.8 (A) 10/04/2021        ASSESSMENT / PLAN / RECOMMENDATIONS:   1) Type 1 Diabetes Mellitus, Sub-optimally  controlled, With out complications - Most recent A1c of 7.5 %. Goal A1c < 7.0 %.     -A1c has trended down but remains above goal -In reviewing tandem/CGM download, the patient has been  noted with postprandial hyperglycemia, hypoglycemic episode were noted in the afternoon and following correction -I am  going to adjust his insulin  to carb ratio and sensitivity factor as below -I will also decrease his basal rate at 4 PM -He was upgraded to Dexcom G7 -BMP, lipid panel, MA/CR ratio normal  Pump   Tandem  Settings  I:C ratio CF  Insulin  type   NOVOLOG        Basal rate            0000 1.10 u/h  12 45    0500 1.40 u/h  12 55   0600 1.400 12 45    0800 1.250 9 45    1400 1.125 9 45   1600 1.250 5 40   2000 1.050 5 40    2100 1.050 7 50    MEDICATIONS: Novolog    EDUCATION / INSTRUCTIONS: BG monitoring instructions: Patient is instructed to check his blood sugars 4 times a day, before meals and beditme. Call [facility] if: BG persistently < 70  I reviewed the Rule of 15 for the treatment of hypoglycemia in detail with the patient. Literature supplied.   2) Diabetic complications:  Eye: Does not have known diabetic retinopathy.  Neuro/ Feet: Does not have known diabetic peripheral neuropathy .  Renal: Patient does not have known baseline CKD. He   is  on an ACEI/ARB at present.     3) Hashimoto's Thyroiditis:  - Pt is clinically euthyroid  -He prefers to take levothyroxine  at night -TSH normal -No change    Continue levothyroxine  50 mcg daily      F/U in 6 months    Signed electronically by: Natale Bail, MD  [facility] Endocrinology  [facility] [address] [place] [place], [place] [address] Phone: [phone] FAX: [phone]   CC: Janelle Mediate, DO [address] [place] [address] Phone: [phone]  Fax: [phone]  Return to Endocrinology clinic as below: No future appointments.

## 2024-07-22 ENCOUNTER — Telehealth: Admitting: Internal Medicine

## 2024-07-22 ENCOUNTER — Telehealth: Payer: Self-pay | Admitting: Internal Medicine

## 2024-07-22 DIAGNOSIS — E063 Autoimmune thyroiditis: Secondary | ICD-10-CM | POA: Diagnosis not present

## 2024-07-22 DIAGNOSIS — E1065 Type 1 diabetes mellitus with hyperglycemia: Secondary | ICD-10-CM

## 2024-07-22 MED ORDER — INSULIN ASPART 100 UNIT/ML IJ SOLN: INTRAMUSCULAR | 3 refills | Status: AC

## 2024-07-22 MED ORDER — LEVOTHYROXINE SODIUM 50 MCG PO TABS: 50.0000 ug | ORAL_TABLET | Freq: Every day | ORAL | 3 refills | Status: AC

## 2024-07-22 NOTE — Progress Notes (Signed)
 Virtual Visit via Video Note  I connected with Chad Holloway on 07/22/24 at  7:30 AM EST by a video enabled telemedicine application and verified that I am speaking with the correct person using two identifiers.   I discussed the limitations of evaluation and management by telemedicine and the availability of in person appointments. The patient expressed understanding and agreed to proceed.   -Location of the patient : Home -Location of the provider : Office -The names of all persons participating in the telemedicine service : Pt and myself        Name: Chad Holloway [facility]  Age/ Sex: 28 y.o., male   MRN/ DOB: [id number], 08/09/1996     PCP: Mathew Bouchard, DO   Reason for Endocrinology Evaluation: Type 1 Diabetes Mellitus  Initial Endocrine Consultative Visit: 10/20/2020    PATIENT IDENTIFIER: Chad Holloway is a 28 y.o. male with a past medical history of T1DM, HTN and Hypothyroidism. . The patient has followed with Endocrinology clinic since 10/20/2020 for consultative assistance with management of his diabetes.  DIABETIC HISTORY:  Chad Holloway was diagnosed with T1DM in the year 2000 at age 78.started insulin  pump therapy in 2009. Started on the T-slim 2020  His hemoglobin A1c has ranged from 7.4% in 2021, peaking at 13.5% in 2017.  In terms of diet, the patient eats 1 meal ( supper) ,snacks through the day   He is a engineer, site     THYROID  HISTORY:  He has been diagnosed with hypothyroidism secondary to Hashimoto's thyroiditis as a child and has been on LT-4 replacement for years.     SUBJECTIVE:   During the last visit (01/22/2024): A1c 7.5%     Today (07/22/2024): Chad Holloway is here for a follow up on diabetes management.  He checks his blood sugars multiple times daily,through CGM.  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, the season is over and he is on a more predictable schedule specially with meals     Pump settings :    Pump   Tandem  Settings  I:C ratio CF  Insulin  type   NOVOLOG        Basal rate            0000 1.10 u/h  10 45    0500 1.40 u/h  10 50   0600 1.400 10 45    0800 1.250 8 45    1400 1.125 8 45   1600 1.250 5 45   2000 1.050 5 40    2100 1.050 6 50        Type & Model of Pump: Tandem Insulin  Type: Currently using Novolog        PUMP STATISTICS: Average BG: 175 Average Daily Carbs (g): 248 Average Total Daily Insulin : 78.86 Average Daily Basal: 36.24(46 %) Average Daily Bolus: 42.63 (54 %)   CONTINUOUS GLUCOSE MONITORING RECORD INTERPRETATION    Dates of Recording: 10/21-11/11/2023  Sensor description: Dexcom  Results statistics:   CGM use % of time 85  Average and SD 175/55  Time in range 55 %  % Time  Above 180 35  % Time Below target 1.2   Glycemic patterns summary: BGs start high overnight eventually trending down to optimal overnight and fluctuate during the day  Hyperglycemic episodes postprandial  Hypoglycemic episodes occurred after breakfast  Overnight periods: Mostly optimal    HOME ENDOCRINE REGIMEN:  Levothyroxine  25 mcg daily  Novolog    Statin: no ACE-I/ARB: yes Prior Diabetic Education: yes     DIABETIC COMPLICATIONS: Microvascular complications:   Denies: CKD, retinopathy, neuropathy  Last Eye Exam: Completed 2021  Macrovascular complications:   Denies: CAD, CVA, PVD   HISTORY:  Past Medical History:  Past Medical History:  Diagnosis Date   Diabetes mellitus type I (HCC)    Hypertension    Hypothyroidism    Past Surgical History:  Past Surgical History:  Procedure Laterality  Date   INCISION AND DRAINAGE ABSCESS Right 09/14/2018   Procedure: INCISION AND DRAINAGE ABSCESS;  Surgeon: Roark Rush, MD;  Location: [facility] OR;  Service: ENT;  Laterality: Right;   Social History:  reports that he has never smoked. He has never used smokeless tobacco. He reports current alcohol use. He reports that he does not use drugs. Family History:  Family History  Problem Relation Age of Onset   Diabetes Mother    Hypertension Mother    Thyroid  disease Mother    Obesity Mother    Thyroid  disease Father    Diabetes Brother      HOME MEDICATIONS: Allergies as of 07/22/2024   No Known Allergies      Medication List        Accurate as of July 22, 2024  7:27 AM. If you have any questions, ask your nurse or doctor.          STOP taking these medications    HYDROcodone -acetaminophen  5-325 MG tablet Commonly known as: NORCO/VICODIN       TAKE these medications    Dexcom G7 Sensor Misc 1 Device by Does not apply route as directed.   glucagon  1 MG injection Follow package directions for low blood sugar.   glucose blood test strip Use as instructed. Check sugar 6 times per day. For use with linking meter for [employer] pump   insulin  aspart 100 UNIT/ML injection Commonly known as: NovoLOG  INJECT UP TO 100 UNITS DAILY IN INSULIN  PUMP   levothyroxine  50 MCG tablet Commonly known as: SYNTHROID  Take 1 tablet (50 mcg total) by mouth daily.   lisinopril  10 MG tablet Commonly known as: ZESTRIL  Take 1 tablet (10 mg total) by mouth daily.         OBJECTIVE:   Vital Signs: There were no vitals taken for this visit.  Wt Readings from Last 3 Encounters:  01/22/24 211 lb (95.7 kg)  06/11/23 212 lb (96.2 kg)  06/08/22 208 lb (94.3 kg)     Exam: General: Pt appears well and is in NAD  Neck: General: Supple without adenopathy. Thyroid : Thyroid  size normal.  No goiter or nodules appreciated.   Lungs: Clear with good BS bilat with no rales, rhonchi, or  wheezes  Heart: RRR   Abdomen: Normoactive bowel sounds, soft, nontender, without masses or organomegaly palpable  Extremities: No pretibial edema.   Neuro: MS is good with appropriate affect, pt is alert and Ox3   DM foot exam: 06/11/2023   The skin of the feet is intact without sores or ulcerations. The pedal pulses are 2+ on right and 2+ on left. The sensation is intact to a screening 5.07, 10 gram monofilament  bilaterally     DATA REVIEWED:  Lab Results  Component Value Date   HGBA1C 7.5 (A) 01/22/2024   HGBA1C 8.0 (A) 06/11/2023   HGBA1C 8.7 (A) 06/08/2022        ASSESSMENT / PLAN / RECOMMENDATIONS:   1) Type 1 Diabetes Mellitus, Sub-optimally  controlled, With out complications -  Goal A1c < 7.0 %.    -This was a virtual visit, unable to obtain an updated A1c, patient has been noted with increase average BG's on Dexcom reading by approximately 10 points. -I have adjusted his insulin  to carb ratio for breakfast to prevent hypoglycemia, I also adjusted his insulin  to carb ratio for afternoon due to hyperglycemia with boluses -I did increase his basal rate for 1400 and 1600 due to hyper glycemia   Pump   Tandem  Settings  I:C ratio CF  Insulin  type   NOVOLOG        Basal rate            0000 1.10 u/h  10 45    0500 1.40 u/h  10 50   0600 1.400 10 45    0800 1.250 9 45    1400 1.150 7 45   1600 1.275 5 45   2000 1.050 5 40    2100 1.050 6 50       MEDICATIONS: Novolog    EDUCATION / INSTRUCTIONS: BG monitoring instructions: Patient is instructed to check his blood sugars 4 times a day, before meals and beditme. Call  Endocrinology clinic if: BG persistently < 70  I reviewed the Rule of 15 for the treatment of hypoglycemia in detail with the patient. Literature supplied.   2) Diabetic complications:  Eye: Does not have known diabetic retinopathy.  Neuro/ Feet: Does not have known diabetic peripheral neuropathy .  Renal: Patient does not have known  baseline CKD. He   is  on an ACEI/ARB at present.     3) Hashimoto's Thyroiditis:  - Patient is clinically euthyroid -He prefers to take levothyroxine  at night - Historically his TSH has been within normal range, no change    Continue levothyroxine  50 mcg daily      F/U in 6 months    Signed electronically by: Stefano Redgie Butts, MD  [facility] Endocrinology  [facility] [address] [place] [place], [place] [address] Phone: [phone] FAX: [phone]   CC: Mathew Bouchard, DO [address] [place] [address] Phone: [phone]  Fax: [phone]  Return to Endocrinology clinic as below: Future Appointments  Date Time Provider Department Center  07/22/2024  7:30 AM Maston Wight, Donell Redgie, MD [id number] None

## 2024-07-22 NOTE — Telephone Encounter (Signed)
Please schedule follow-up appointment with me in 6 months  Thanks
